# Patient Record
Sex: Female | Born: 1960 | Race: White | Hispanic: No | Smoking: Former smoker
Health system: Southern US, Community
[De-identification: ages and names within clinical notes are randomized; demographics above are authoritative.]

## PROBLEM LIST (undated history)

## (undated) ENCOUNTER — Emergency Department (HOSPITAL_COMMUNITY): Admission: EM | Payer: Self-pay | Source: Home / Self Care

## (undated) DIAGNOSIS — E079 Disorder of thyroid, unspecified: Secondary | ICD-10-CM

## (undated) DIAGNOSIS — E785 Hyperlipidemia, unspecified: Secondary | ICD-10-CM

## (undated) HISTORY — PX: CHOLECYSTECTOMY: SHX55

## (undated) HISTORY — PX: SHOULDER SURGERY: SHX246

## (undated) HISTORY — DX: Disorder of thyroid, unspecified: E07.9

## (undated) HISTORY — DX: Hyperlipidemia, unspecified: E78.5

---

## 2002-07-25 ENCOUNTER — Other Ambulatory Visit: Admission: RE | Admit: 2002-07-25 | Discharge: 2002-07-25 | Payer: Self-pay | Admitting: Family Medicine

## 2004-08-30 ENCOUNTER — Other Ambulatory Visit: Admission: RE | Admit: 2004-08-30 | Discharge: 2004-08-30 | Payer: Self-pay | Admitting: Obstetrics and Gynecology

## 2005-10-04 ENCOUNTER — Ambulatory Visit (HOSPITAL_COMMUNITY): Admission: RE | Admit: 2005-10-04 | Discharge: 2005-10-04 | Payer: Self-pay | Admitting: Obstetrics and Gynecology

## 2007-05-22 ENCOUNTER — Encounter: Payer: Self-pay | Admitting: Cardiovascular Disease

## 2007-06-14 HISTORY — PX: ANKLE RECONSTRUCTION: SHX1151

## 2008-12-08 ENCOUNTER — Ambulatory Visit: Payer: Self-pay | Admitting: Gastroenterology

## 2008-12-22 ENCOUNTER — Ambulatory Visit (HOSPITAL_COMMUNITY): Admission: EM | Admit: 2008-12-22 | Discharge: 2008-12-22 | Payer: Self-pay | Admitting: Emergency Medicine

## 2008-12-22 ENCOUNTER — Ambulatory Visit: Payer: Self-pay | Admitting: Gastroenterology

## 2008-12-24 ENCOUNTER — Telehealth: Payer: Self-pay | Admitting: Gastroenterology

## 2008-12-31 ENCOUNTER — Encounter: Payer: Self-pay | Admitting: Gastroenterology

## 2009-01-01 ENCOUNTER — Telehealth (INDEPENDENT_AMBULATORY_CARE_PROVIDER_SITE_OTHER): Payer: Self-pay | Admitting: *Deleted

## 2009-10-20 IMAGING — CT CT HEAD W/O CM
2 series · 17 of 30 positions shown, 20 images · non-contrast
Comparison: None.

CLINICAL DATA: Headache.  Hypertension.

CT HEAD WITHOUT CONTRAST
TECHNIQUE: Contiguous axial images were obtained from the base of
the skull through the vertex without contrast.

[Series 2: head_seq 4.5 h37s st · axial · 0.43mm/px · z∈[-121,+5]mm · 10 of 36 slices shown, 13 images]
[im 4/36  brain]
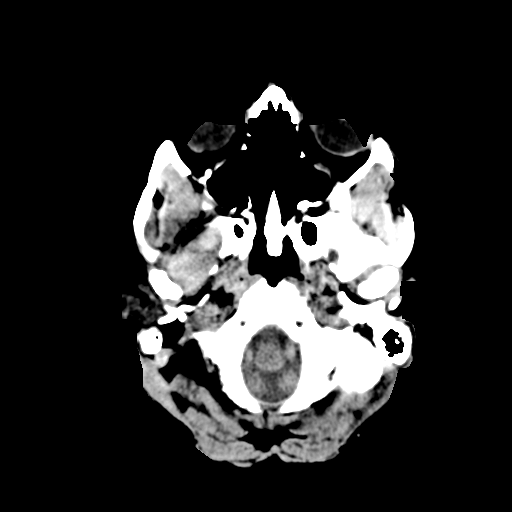
[im 4/36  bone]
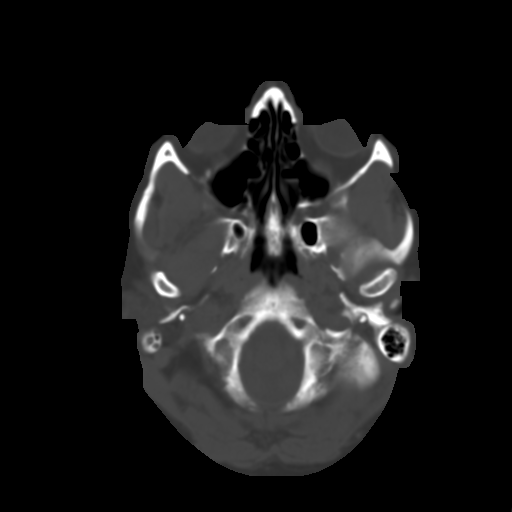
[im 7/36  brain]
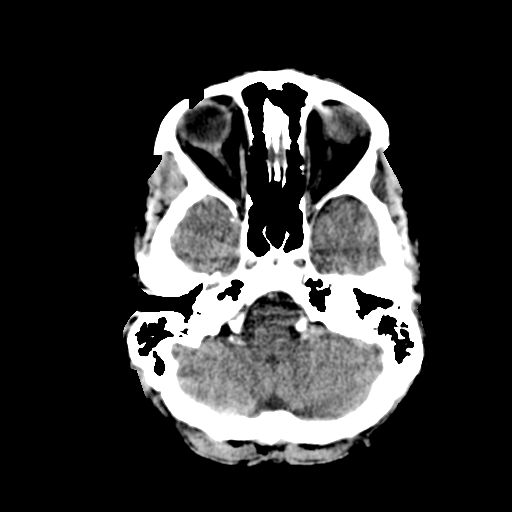
[im 10/36  brain]
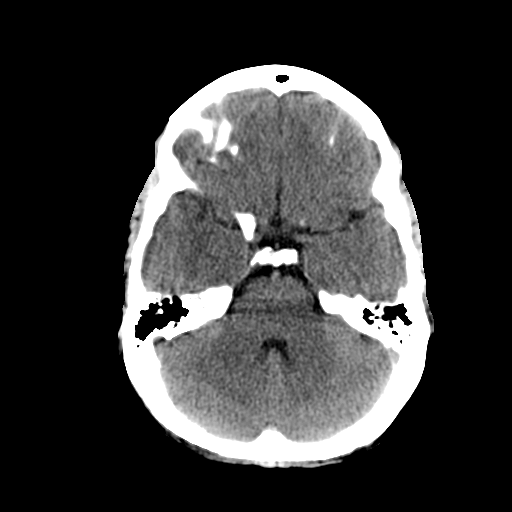
[im 13/36  brain]
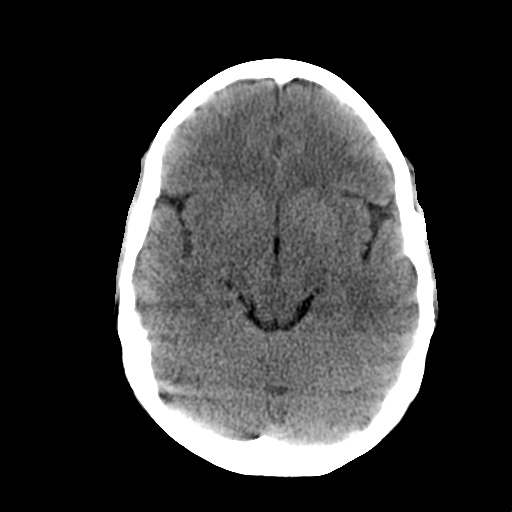
[im 16/36  brain]
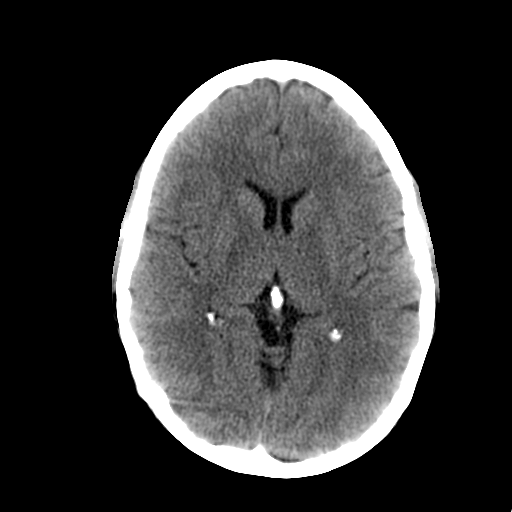
[im 16/36  bone]
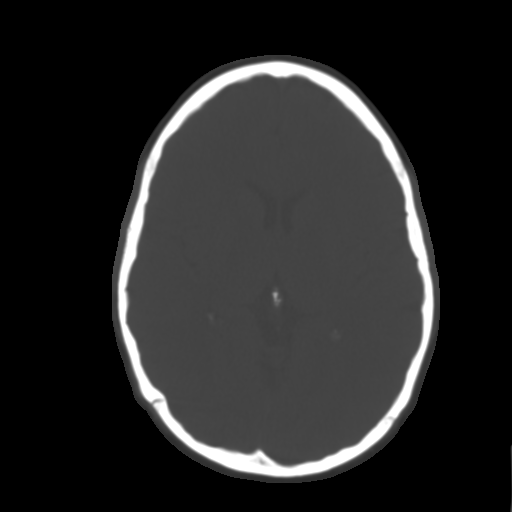
[im 20/36  brain]
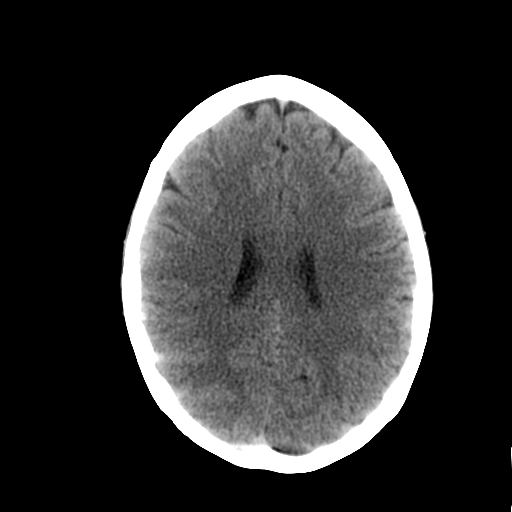
[im 23/36  brain]
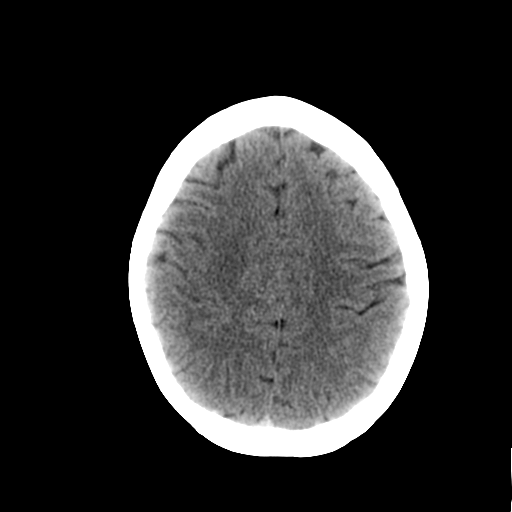
[im 26/36  brain]
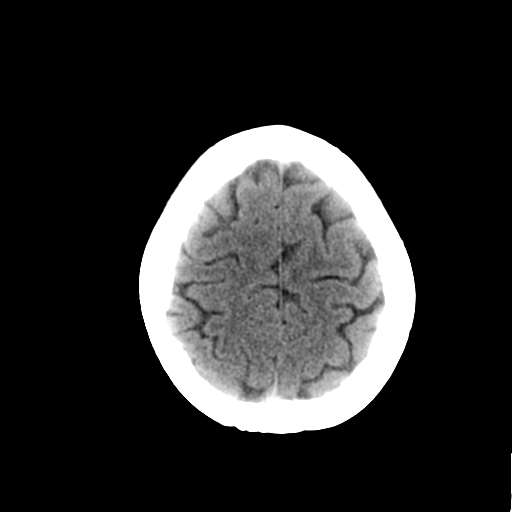
[im 29/36  brain]
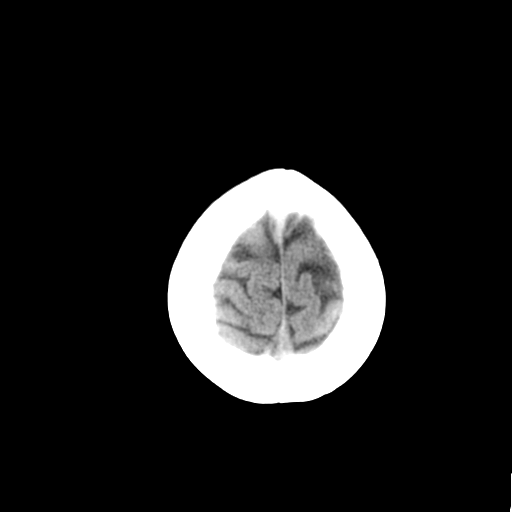
[im 29/36  bone]
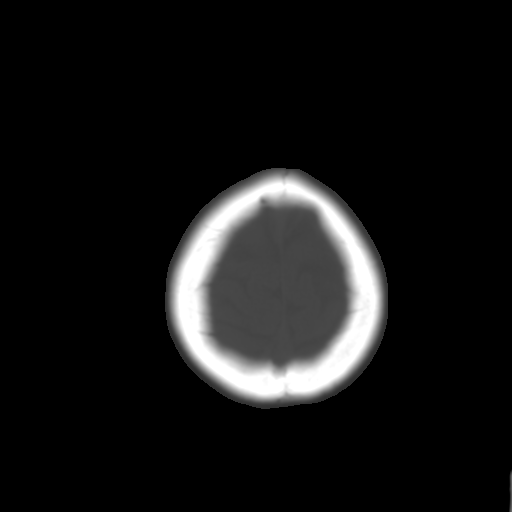
[im 32/36  brain]
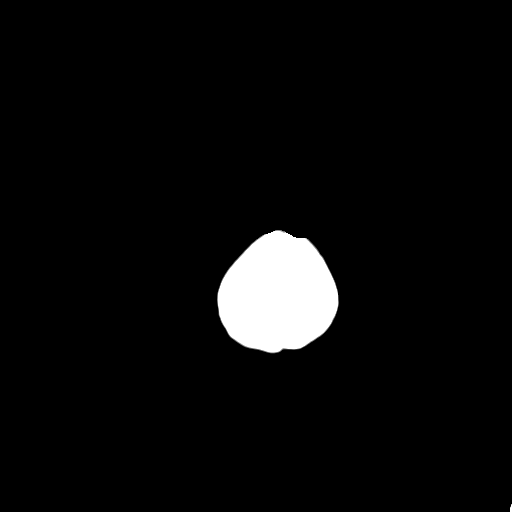

[Series 3: head_seq 3.0 h60s bone · axial · 0.43mm/px · z∈[-118,-6]mm · 7 of 54 slices shown]
[im 7/54  bone]
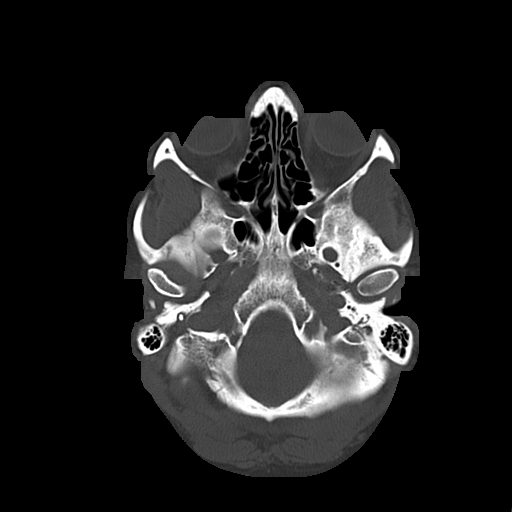
[im 13/54  bone]
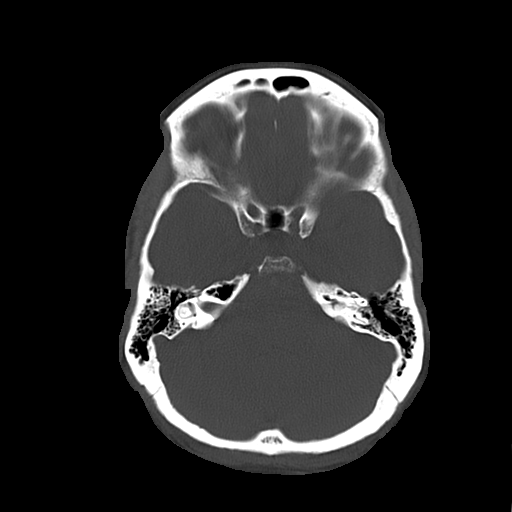
[im 19/54  bone]
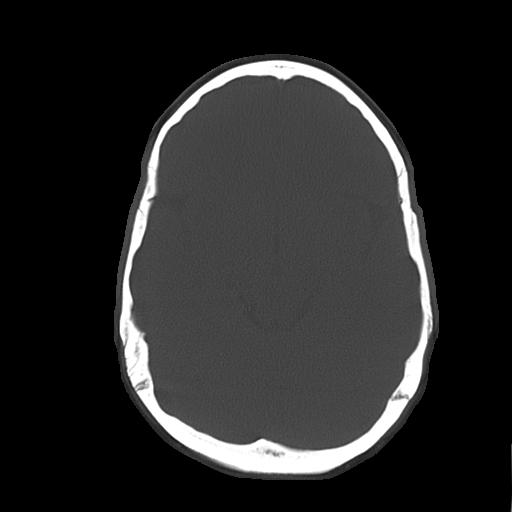
[im 25/54  bone]
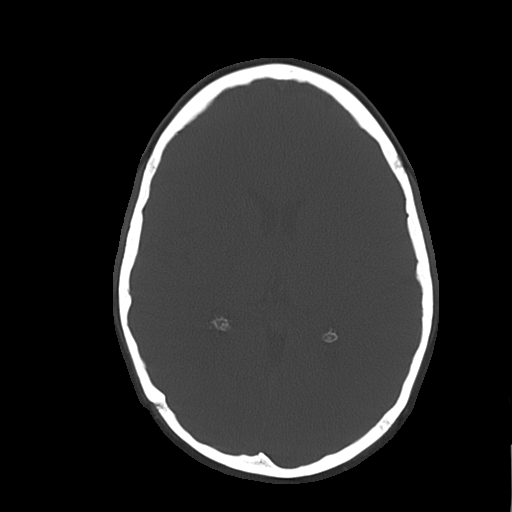
[im 32/54  bone]
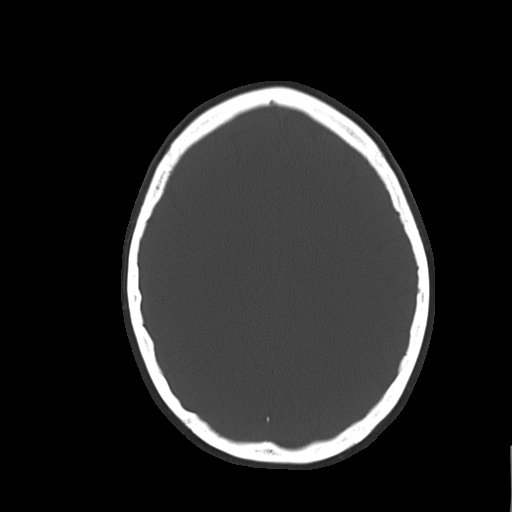
[im 38/54  bone]
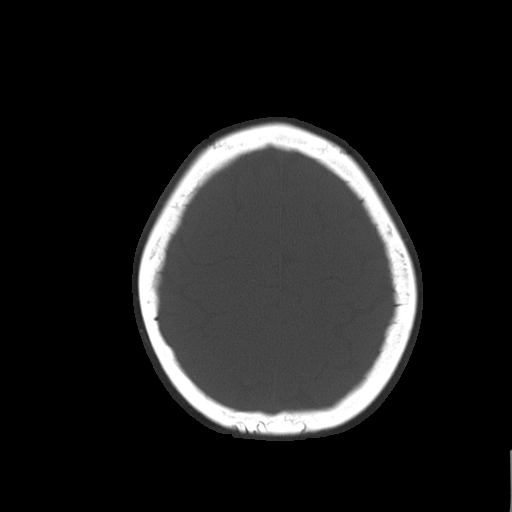
[im 44/54  bone]
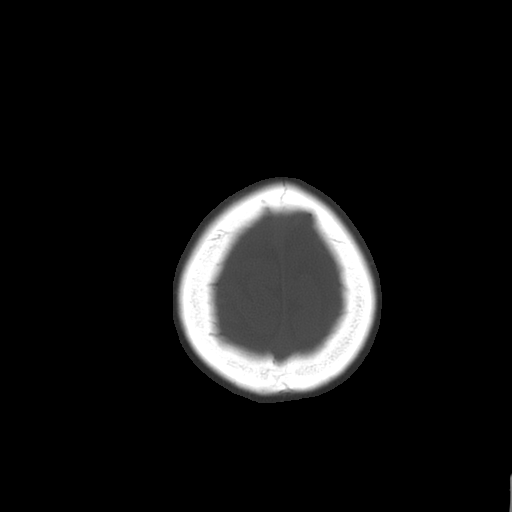

[17 of 30 positions shown; findings below may reference images not displayed]

FINDINGS: Normal appearing cerebral hemispheres and posterior fossa
structures.  Normal size and position of the ventricles.  No
intracranial hemorrhage, mass lesion or evidence of acute
infarction.  Unremarkable bones and included portions of the
paranasal sinuses.
IMPRESSION: Normal examination.

## 2011-10-18 ENCOUNTER — Encounter: Payer: Self-pay | Admitting: *Deleted

## 2012-03-14 ENCOUNTER — Encounter: Payer: Self-pay | Admitting: Cardiovascular Disease

## 2015-01-14 ENCOUNTER — Encounter: Payer: Self-pay | Admitting: Gastroenterology

## 2016-08-30 ENCOUNTER — Ambulatory Visit (INDEPENDENT_AMBULATORY_CARE_PROVIDER_SITE_OTHER): Payer: BLUE CROSS/BLUE SHIELD | Admitting: Orthopaedic Surgery

## 2016-08-30 ENCOUNTER — Encounter (INDEPENDENT_AMBULATORY_CARE_PROVIDER_SITE_OTHER): Payer: Self-pay | Admitting: Orthopaedic Surgery

## 2016-08-30 ENCOUNTER — Ambulatory Visit (INDEPENDENT_AMBULATORY_CARE_PROVIDER_SITE_OTHER): Payer: Self-pay

## 2016-08-30 VITALS — BP 129/92 | HR 64 | Resp 14 | Ht 65.0 in | Wt 192.0 lb

## 2016-08-30 DIAGNOSIS — S93492A Sprain of other ligament of left ankle, initial encounter: Secondary | ICD-10-CM

## 2016-08-30 DIAGNOSIS — M25572 Pain in left ankle and joints of left foot: Secondary | ICD-10-CM

## 2016-08-30 NOTE — Progress Notes (Signed)
Office Visit Note   Patient: Kristin Ramirez           Date of Birth: 10-22-1960           MRN: [id number] Visit Date: 08/30/2016              Requested by: No referring provider defined for this encounter. PCP: No primary care provider on file.   Assessment & Plan: Visit Diagnoses:  1. Sprain of anterior talofibular ligament of left ankle, initial encounter   2. Pain in left ankle and joints of left foot     Plan: #1: Equalizer boot #2: Ice and elevate  Follow-Up Instructions: Return in about 2 weeks (around 09/13/2016).   Orders:  Orders Placed This Encounter  Procedures  . XR Ankle Complete Left   No orders of the defined types were placed in this encounter.     Procedures: No procedures performed   Clinical Data: No additional findings.   Subjective: Chief Complaint  Patient presents with  . Left Ankle - Edema, Pain    Kristin Ramirez is a 56 year old female presenting with left ankle pain. She had a Chrisman Snook procedure  left ankle in 2009 and a done very well until this weekend. She states at that time she went to get up from a sitting position however her foot and ankle was tingling but she did not wait until the sensation came back. She got up and started to go and that is when she twisted her ankle, that of a more inversion type injury. She feared something happened as she has edema, ecchymosis and pain. Seen today for evaluation.    Review of Systems  Constitutional: Negative.   HENT: Negative.   Respiratory: Negative.   Cardiovascular: Negative.   Gastrointestinal: Negative.   Genitourinary: Negative.   Skin: Negative.   Neurological: Negative.   Hematological: Negative.   Psychiatric/Behavioral: Negative.      Objective: Vital Signs: BP (!) 129/92   Pulse 64   Resp 14   Ht 5\' 5"  (1.651 m)   Wt 192 lb (87.1 kg)   BMI 31.95 kg/m   Physical Exam  Constitutional: She is oriented to person, place, and time. She appears well-developed and  well-nourished.  HENT:  Head: Normocephalic and atraumatic.  Eyes: EOM are normal. Pupils are equal, round, and reactive to light.  Pulmonary/Chest: Effort normal.  Neurological: She is alert and oriented to person, place, and time.  Skin: Skin is warm and dry.  Psychiatric: She has a normal mood and affect. Her behavior is normal. Judgment and thought content normal.    Left Ankle Exam  Swelling: moderate  Tenderness  The patient is experiencing tenderness in the ATF, CF and lateral malleolus.   Range of Motion  Dorsiflexion: 10  Plantar flexion: 20   Tests  Anterior drawer: 1+ Varus tilt: 2+  Other  Scars: present Sensation: normal  Comments:  She certainly has more inversion laxity then drawer at this time. Diffusely about the ankle but not really much medially. Lot of swelling laterally. Fibular tibial pain.      Specialty Comments:  No specialty comments available.  Imaging: Xr Ankle Complete Left  Result Date: 08/30/2016 Three-view x-rays of the left ankle reveals certainly some spurring medially with some degenerative changes noted at the tibiotalar joint. Also some probable HO in the chair fibular area. Some cystic changes in the talus as well as the anterior tibiotalar joint. Os trigone was noted  PMFS History: There are no active problems to display for this patient.  Past Medical History:  Diagnosis Date  . Hyperlipidemia   . Thyroid disease     Family History  Problem Relation Age of Onset  . Hypertension Mother   . Hypertension Father   . Heart disease Father     Past Surgical History:  Procedure Laterality Date  . ANKLE RECONSTRUCTION  06/2007   left  . CHOLECYSTECTOMY    . SHOULDER SURGERY     right and left   Social History   Occupational History  . Not on file.   Social History Main Topics  . Smoking status: Former Smoker    Quit date: 10/17/2009  . Smokeless tobacco: Never Used  . Alcohol use No  . Drug use: No  . Sexual  activity: Not on file

## 2016-09-14 ENCOUNTER — Encounter (INDEPENDENT_AMBULATORY_CARE_PROVIDER_SITE_OTHER): Payer: Self-pay | Admitting: Orthopedic Surgery

## 2016-09-14 ENCOUNTER — Ambulatory Visit (INDEPENDENT_AMBULATORY_CARE_PROVIDER_SITE_OTHER): Payer: BLUE CROSS/BLUE SHIELD | Admitting: Orthopedic Surgery

## 2016-09-14 ENCOUNTER — Ambulatory Visit (INDEPENDENT_AMBULATORY_CARE_PROVIDER_SITE_OTHER): Payer: Self-pay

## 2016-09-14 VITALS — BP 136/81 | HR 54 | Resp 12 | Ht 65.0 in | Wt 195.0 lb

## 2016-09-14 DIAGNOSIS — M25572 Pain in left ankle and joints of left foot: Secondary | ICD-10-CM

## 2016-09-14 DIAGNOSIS — S93492D Sprain of other ligament of left ankle, subsequent encounter: Secondary | ICD-10-CM

## 2016-09-14 NOTE — Progress Notes (Signed)
Office Visit Note   Patient: Kristin Ramirez           Date of Birth: 09-25-1960           MRN: [id number] Visit Date: 09/14/2016              Requested by: No referring provider defined for this encounter. PCP: No PCP Per Patient   Assessment & Plan: Visit Diagnoses:  1. Sprain of anterior talofibular ligament of left ankle, subsequent encounter   2. Pain of joint of left ankle and foot     Plan:  #1: Swedo ankle brace was placed. She was comfortable in this #2: If this is painful and she can return to the boot.   Follow-Up Instructions: Return in about 3 weeks (around 10/05/2016).   Orders:  Orders Placed This Encounter  Procedures  . XR Ankle Complete Left   No orders of the defined types were placed in this encounter.     Procedures: No procedures performed   Clinical Data: No additional findings.   Subjective: Chief Complaint  Patient presents with  . Left Ankle - Follow-up    Patient is here today for a 2 week follow up of left ankle.  Patient is wearing boot today, she states that she has been wearing boot everyday.  She complains of some swelling and being "uncomfortable" in boot.  She states she is not having any weakness or pain.     Review of Systems  Constitutional: Negative.   HENT: Negative.   Respiratory: Negative.   Cardiovascular: Negative.   Gastrointestinal: Negative.   Genitourinary: Negative.   Skin: Negative.   Neurological: Negative.   Hematological: Negative.   Psychiatric/Behavioral: Negative.      Objective: Vital Signs: BP 136/81   Pulse (!) 54   Resp 12   Ht  (1.651 m)   Wt 195 lb (88.5 kg)   BMI 32.45 kg/m   Physical Exam  Constitutional: She is oriented to person, place, and time. She appears well-developed and well-nourished.  HENT:  Head: Normocephalic and atraumatic.  Eyes: EOM are normal. Pupils are equal, round, and reactive to light.  Pulmonary/Chest: Effort normal.  Neurological: She is alert and  oriented to person, place, and time.  Skin: Skin is warm and dry.  Psychiatric: She has a normal mood and affect. Her behavior is normal. Judgment and thought content normal.    Left Ankle Exam  Swelling: mild  Tenderness  The patient is experiencing tenderness in the ATF and lateral malleolus.   Tests  Anterior drawer: 1+ Varus tilt: 1+  Other  Erythema: absent Scars: absent Sensation: normal Pulse: present  Comments:  Assessment tenderness over the distal fibula. Also over the ATF. Less over the PTF.      Specialty Comments:  No specialty comments available.  Imaging: Xr Ankle Complete Left  Result Date: 09/14/2016 Review x-rays of the left ankle do not reveal any occult fractures. He does have changes from her previous ligament reconstruction surgery.    PMFS History: There are no active problems to display for this patient.  Past Medical History:  Diagnosis Date  . Hyperlipidemia   . Thyroid disease     Family History  Problem Relation Age of Onset  . Hypertension Mother   . Hypertension Father   . Heart disease Father     Past Surgical History:  Procedure Laterality Date  . ANKLE RECONSTRUCTION  06/2007   left  . CHOLECYSTECTOMY    .  SHOULDER SURGERY     right and left   Social History   Occupational History  . Not on file.   Social History Main Topics  . Smoking status: Former Smoker    Quit date: 10/17/2009  . Smokeless tobacco: Never Used  . Alcohol use No  . Drug use: No  . Sexual activity: Not on file

## 2016-10-05 ENCOUNTER — Ambulatory Visit (INDEPENDENT_AMBULATORY_CARE_PROVIDER_SITE_OTHER): Payer: BLUE CROSS/BLUE SHIELD | Admitting: Orthopedic Surgery

## 2019-01-09 ENCOUNTER — Encounter: Payer: Self-pay | Admitting: Gastroenterology

## 2019-07-29 DIAGNOSIS — M2041 Other hammer toe(s) (acquired), right foot: Secondary | ICD-10-CM | POA: Diagnosis not present

## 2019-10-15 DIAGNOSIS — L570 Actinic keratosis: Secondary | ICD-10-CM | POA: Diagnosis not present

## 2019-10-15 DIAGNOSIS — L821 Other seborrheic keratosis: Secondary | ICD-10-CM | POA: Diagnosis not present

## 2019-10-15 DIAGNOSIS — D225 Melanocytic nevi of trunk: Secondary | ICD-10-CM | POA: Diagnosis not present

## 2019-10-15 DIAGNOSIS — Z86018 Personal history of other benign neoplasm: Secondary | ICD-10-CM | POA: Diagnosis not present

## 2019-10-15 DIAGNOSIS — L814 Other melanin hyperpigmentation: Secondary | ICD-10-CM | POA: Diagnosis not present

## 2019-10-24 DIAGNOSIS — Z0000 Encounter for general adult medical examination without abnormal findings: Secondary | ICD-10-CM | POA: Diagnosis not present

## 2019-10-24 DIAGNOSIS — Z01419 Encounter for gynecological examination (general) (routine) without abnormal findings: Secondary | ICD-10-CM | POA: Diagnosis not present

## 2019-10-24 DIAGNOSIS — Z6832 Body mass index (BMI) 32.0-32.9, adult: Secondary | ICD-10-CM | POA: Diagnosis not present

## 2019-10-24 DIAGNOSIS — E039 Hypothyroidism, unspecified: Secondary | ICD-10-CM | POA: Diagnosis not present

## 2019-11-04 DIAGNOSIS — Z1211 Encounter for screening for malignant neoplasm of colon: Secondary | ICD-10-CM | POA: Diagnosis not present

## 2019-11-07 LAB — COLOGUARD: COLOGUARD: NEGATIVE

## 2019-11-20 DIAGNOSIS — Z1231 Encounter for screening mammogram for malignant neoplasm of breast: Secondary | ICD-10-CM | POA: Diagnosis not present

## 2020-01-31 DIAGNOSIS — M25561 Pain in right knee: Secondary | ICD-10-CM | POA: Diagnosis not present

## 2020-01-31 DIAGNOSIS — M1711 Unilateral primary osteoarthritis, right knee: Secondary | ICD-10-CM | POA: Diagnosis not present

## 2020-08-27 DIAGNOSIS — Z713 Dietary counseling and surveillance: Secondary | ICD-10-CM | POA: Diagnosis not present

## 2020-11-19 DIAGNOSIS — E039 Hypothyroidism, unspecified: Secondary | ICD-10-CM | POA: Diagnosis not present

## 2021-04-20 DIAGNOSIS — R7309 Other abnormal glucose: Secondary | ICD-10-CM | POA: Diagnosis not present

## 2021-04-20 DIAGNOSIS — E782 Mixed hyperlipidemia: Secondary | ICD-10-CM | POA: Diagnosis not present

## 2021-04-20 DIAGNOSIS — E039 Hypothyroidism, unspecified: Secondary | ICD-10-CM | POA: Diagnosis not present

## 2021-07-01 DIAGNOSIS — Z1231 Encounter for screening mammogram for malignant neoplasm of breast: Secondary | ICD-10-CM | POA: Diagnosis not present

## 2021-11-23 ENCOUNTER — Ambulatory Visit (INDEPENDENT_AMBULATORY_CARE_PROVIDER_SITE_OTHER): Payer: 59 | Admitting: Nurse Practitioner

## 2021-11-23 ENCOUNTER — Encounter (HOSPITAL_BASED_OUTPATIENT_CLINIC_OR_DEPARTMENT_OTHER): Payer: Self-pay | Admitting: Nurse Practitioner

## 2021-11-23 VITALS — BP 117/72 | HR 71 | Ht 65.0 in | Wt 219.0 lb

## 2021-11-23 DIAGNOSIS — E039 Hypothyroidism, unspecified: Secondary | ICD-10-CM | POA: Insufficient documentation

## 2021-11-23 DIAGNOSIS — Z6836 Body mass index (BMI) 36.0-36.9, adult: Secondary | ICD-10-CM | POA: Diagnosis not present

## 2021-11-23 DIAGNOSIS — E890 Postprocedural hypothyroidism: Secondary | ICD-10-CM

## 2021-11-23 DIAGNOSIS — Z0000 Encounter for general adult medical examination without abnormal findings: Secondary | ICD-10-CM | POA: Diagnosis not present

## 2021-11-23 NOTE — Progress Notes (Signed)
Mammo- [place]  T dap not known No shingles vac PAP 2 yrs [facility] -2022

## 2021-11-23 NOTE — Progress Notes (Signed)
Kristin Render, DNP, AGNP-c [facility] [address]  [place], [place] [address] [phone]  New patient visit   Patient: Kristin Ramirez   DOB: July 17, 1960   61 y.o. Female  MRN: [id number] Visit Date: 11/23/2021  Patient Care Team: Kristin Ramirez, Kristin Pesa, NP as PCP - General (Nurse Practitioner)  Today's Vitals   11/23/21 0934  BP: 117/72  Pulse: 71  SpO2: 97%  Weight: 219 lb (99.3 kg)  Height: _0  (1.651 m)   Body mass index is 36.44 kg/m.   Today's healthcare provider: Orma Render, NP   Chief Complaint  Patient presents with   New Patient (Initial Visit)    Patient presents today to establish care. 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 female who presents today as a new patient to establish care.    Patient endorses the following concerns presently: Hypothyroid - previous tumor removal at about 61 years of age - has been on synthroid since that time - levels have overall been stable - currently on annual monitoring.   She has recently retired and tells me she is enjoying retirement She eats a healthy well balanced diet with more chicken and fish as opposed to red meats.  She stays active.   History reviewed and reveals the following: Past Medical History:  Diagnosis Date   Hyperlipidemia    Thyroid disease    Past Surgical History:  Procedure Laterality Date   ANKLE RECONSTRUCTION  06/2007   left   CHOLECYSTECTOMY     SHOULDER SURGERY     right and left   Family Status  Relation Name Status   Mother  Alive   Father  Deceased   Sister  Alive   Brother  Alive   Family History  Problem Relation Age of Onset   Hypertension Mother    Hypertension Father    Heart disease Father    Bladder Cancer Brother    Social History   Socioeconomic History   Marital status: Divorced    Spouse name: Not on file   Number of  children: 0   Years of education: Not on file   Highest education level: Not on file  Occupational History   Occupation: retired from [employer] Use   Smoking status: Former    Types: Cigarettes    Quit date: 10/17/2009    Years since quitting: 12.1   Smokeless tobacco: Never  Vaping Use   Vaping Use: Never used  Substance and Sexual Activity   Alcohol use: No   Drug use: No   Sexual activity: Not Currently  Other Topics Concern   Not on file  Social History Narrative   Not on file   Social Determinants of Health   Financial Resource Strain: Not on file  Food Insecurity: Not on file  Transportation Needs: Not on file  Physical Activity: Not on file  Stress: Not on file  Social Connections: Not on file   Outpatient Medications Prior to Visit  Medication Sig   Multiple Vitamin (MULTIVITAMIN) tablet Take 1 tablet by mouth daily.   [DISCONTINUED] levothyroxine (SYNTHROID) 125 MCG tablet Take 125 mcg by mouth daily.   [DISCONTINUED] fish oil-omega-3 fatty acids 1000 MG capsule Take 1 g by mouth daily. (Patient not taking: Reported on 11/23/2021)   [DISCONTINUED] Flaxseed Oil OIL 1 tablet by  Does not apply route daily. (Patient not taking: Reported on 11/23/2021)   [DISCONTINUED] levothyroxine (SYNTHROID) 150 MCG tablet Take 150 mcg by mouth daily. (Patient not taking: Reported on 11/23/2021)   [DISCONTINUED] simvastatin (ZOCOR) 40 MG tablet Take 40 mg by mouth every evening. (Patient not taking: Reported on 11/23/2021)   No facility-administered medications prior to visit.   No Known Allergies  There is no immunization history on file for this patient.  Review of Systems All review of systems negative except what is listed in the HPI   Objective    BP 117/72   Pulse 71   Ht _0  (1.651 m)   Wt 219 lb (99.3 kg)   SpO2 97%   BMI 36.44 kg/m  Physical Exam Vitals and nursing note reviewed.  Constitutional:      General: She is not in acute distress.     Appearance: Normal appearance.  Eyes:     Extraocular Movements: Extraocular movements intact.     Conjunctiva/sclera: Conjunctivae normal.     Pupils: Pupils are equal, round, and reactive to light.  Neck:     Vascular: No carotid bruit.  Cardiovascular:     Rate and Rhythm: Normal rate and regular rhythm.     Pulses: Normal pulses.     Heart sounds: Normal heart sounds. No murmur heard. Pulmonary:     Effort: Pulmonary effort is normal.     Breath sounds: Normal breath sounds. No wheezing.  Abdominal:     General: Bowel sounds are normal.     Palpations: Abdomen is soft.  Musculoskeletal:        General: Normal range of motion.     Cervical back: Normal range of motion. No tenderness.     Right lower leg: No edema.     Left lower leg: No edema.  Lymphadenopathy:     Cervical: No cervical adenopathy.  Skin:    General: Skin is warm and dry.     Capillary Refill: Capillary refill takes less than 2 seconds.  Neurological:     General: No focal deficit present.     Mental Status: She is alert and oriented to person, place, and time.  Psychiatric:        Mood and Affect: Mood normal.        Behavior: Behavior normal.        Thought Content: Thought content normal.        Judgment: Judgment normal.     Results for orders placed or performed in visit on 11/23/21  Hemoglobin A1c  Result Value Ref Range   Hgb A1c MFr Bld 5.6 4.8 - 5.6 %   Est. average glucose Bld gHb Est-mCnc 114 mg/dL  Comprehensive metabolic panel  Result Value Ref Range   Glucose 96 70 - 99 mg/dL   BUN 11 8 - 27 mg/dL   Creatinine, Ser 0.79 0.57 - 1.00 mg/dL   eGFR 84 >59 mL/min/1.73   BUN/Creatinine Ratio 14 12 - 28   Sodium 141 134 - 144 mmol/L   Potassium 4.7 3.5 - 5.2 mmol/L   Chloride 106 96 - 106 mmol/L   CO2 22 20 - 29 mmol/L   Calcium 9.7 8.7 - 10.3 mg/dL   Total Protein 6.7 6.0 - 8.5 g/dL   Albumin 4.1 3.8 - 4.8 g/dL   Globulin, Total 2.6 1.5 - 4.5 g/dL   Albumin/Globulin Ratio 1.6 1.2 -  2.2   Bilirubin Total 0.5 0.0 - 1.2 mg/dL   Alkaline Phosphatase  73 44 - 121 IU/L   AST 18 0 - 40 IU/L   ALT 13 0 - 32 IU/L  CBC With Diff/Platelet  Result Value Ref Range   WBC 8.8 3.4 - 10.8 x10E3/uL   RBC 4.77 3.77 - 5.28 x10E6/uL   Hemoglobin 13.0 11.1 - 15.9 g/dL   Hematocrit 39.5 34.0 - 46.6 %   MCV 83 79 - 97 fL   MCH 27.3 26.6 - 33.0 pg   MCHC 32.9 31.5 - 35.7 g/dL   RDW 14.5 11.7 - 15.4 %   Platelets 371 150 - 450 x10E3/uL   Neutrophils 66 Not Estab. %   Lymphs 18 Not Estab. %   Monocytes 5 Not Estab. %   Eos 10 Not Estab. %   Basos 1 Not Estab. %   Neutrophils Absolute 5.8 1.4 - 7.0 x10E3/uL   Lymphocytes Absolute 1.6 0.7 - 3.1 x10E3/uL   Monocytes Absolute 0.5 0.1 - 0.9 x10E3/uL   EOS (ABSOLUTE) 0.9 (H) 0.0 - 0.4 x10E3/uL   Basophils Absolute 0.1 0.0 - 0.2 x10E3/uL   Immature Granulocytes 0 Not Estab. 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  Patient has historically been stable on current dose of levothyroxine without need for changes.  Will obtain labs today for monitoring.  No alarm symptoms present at this time.  We will make changes to plan of care based on laboratory findings as needed.  Patient aware if any new symptoms occur to contact the office for follow-up.      Relevant Medications   levothyroxine (SYNTHROID) 125 MCG tablet   Other  Relevant Orders   Thyroid Panel With TSH (Completed)   Thyroid Panel With TSH   Body mass index (BMI) 36.0-36.9, adult    Elevated BMI today with no alarm symptoms present.  Recommendations for diet and activity provided to patient on after visit summary.  We will also obtain labs today for baseline.      Other Visit Diagnoses     Healthcare maintenance       Relevant Orders   Hemoglobin A1c (Completed)   Comprehensive metabolic panel (Completed)   CBC With Diff/Platelet (Completed)   Lipid panel (Completed)   Thyroid Panel With TSH (Completed)        Return in about 1 year (around 11/24/2022) for CPE today- CPE in 1 year.      Draco Malczewski, Kristin Pesa, NP, DNP, AGNP-C [facility] [place]

## 2021-11-23 NOTE — Patient Instructions (Signed)
Thank you for choosing [place] [facility] for your Primary Care needs. I am excited for the opportunity to partner with you to meet your health care goals. It was a pleasure meeting you today!  Recommendations from today's visit: We will get labs today to make sure everything looks OK and your thyroid is stable.  I will send in the refill of the synthroid (levothyroxine) once we get those results - in case we need to make any changes.  If you should have any concerns or health needs, please do not hesitate to reach out.   Information on diet, exercise, and health maintenance recommendations are listed below. This is information to help you be sure you are on track for optimal health and monitoring.   Please look over this and let us know if you have any questions or if you have completed any of the health maintenance outside of [place] so that we can be sure your records are up to date.  ___________________________________________________________ About Me: I am an Adult-Geriatric Nurse Practitioner with a background in caring for patients for more than 20 years with a strong intensive care background. I provide primary care and sports medicine services to patients age 45 and older within this office. My education had a strong focus on caring for the older adult population, which I am passionate about. I am also the director of the APP Fellowship with [facility].   My desire is to provide you with the best service through preventive medicine and supportive care. I consider you a part of the medical team and value your input. I work diligently to ensure that you are heard and your needs are met in a safe and effective manner. I want you to feel comfortable with me as your provider and want you to know that your health concerns are important to me.  For your information, our office hours are: Monday, Tuesday, and Thursday 8:00 AM - 5:00 PM Wednesday and Friday 8:00 AM  - 12:00 PM.   In my time away from the office I am teaching new APP's within the system and am unavailable, but my partner, Dr. Burnard Bunting is in the office for emergent needs.   If you have questions or concerns, please call our office at [phone] or send [place] a MyChart message and we will respond as quickly as possible.  ____________________________________________________________ MyChart:  For all urgent or time sensitive needs we ask that you please call the office to avoid delays. Our number is [phone]. MyChart is not constantly monitored and due to the large volume of messages a day, replies may take up to 72 business hours.  MyChart Policy: MyChart allows for you to see your visit notes, after visit summary, provider recommendations, lab and tests results, make an appointment, request refills, and contact your provider or the office for non-urgent questions or concerns. Providers are seeing patients during normal business hours and do not have built in time to review MyChart messages.  We ask that you allow a minimum of 3 business days for responses to [employer]. For this reason, please do not send urgent requests through [place]. Please call the office at [phone]. New and ongoing conditions may require a visit. We have virtual and in person visit available for your convenience.  Complex MyChart concerns may require a visit. Your provider may request you schedule a virtual or in person visit to ensure we are providing the best care possible. MyChart messages sent  after 11:00 AM on Friday will not be received by the provider until Monday morning.    Lab and Test Results: You will receive your lab and test results on MyChart as soon as they are completed and results have been sent by the lab or testing facility. Due to this service, you will receive your results BEFORE your provider.  I review lab and tests results each morning prior to seeing patients. Some results require  collaboration with other providers to ensure you are receiving the most appropriate care. For this reason, we ask that you please allow a minimum of 3-5 business days from the time the ALL results have been received for your provider to receive and review lab and test results and contact you about these.  Most lab and test result comments from the provider will be sent through [place]. Your provider may recommend changes to the plan of care, follow-up visits, repeat testing, ask questions, or request an office visit to discuss these results. You may reply directly to this message or call the office at [phone] to provide information for the provider or set up an appointment. In some instances, you will be called with test results and recommendations. Please let us know if this is preferred and we will make note of this in your chart to provide this for you.    If you have not heard a response to your lab or test results in 5 business days from all results returning to [place], please call the office to let us know. We ask that you please avoid calling prior to this time unless there is an emergent concern. Due to high call volumes, this can delay the resulting process.  After Hours: For all non-emergency after hours needs, please call the office at [phone] and select the option to reach the on-call provider service. On-call services are shared between multiple [place] offices and therefore it will not be possible to speak directly with your provider. On-call providers may provide medical advice and recommendations, but are unable to provide refills for maintenance medications.  For all emergency or urgent medical needs after normal business hours, we recommend that you seek care at the closest Urgent Care or Emergency Department to ensure appropriate treatment in a timely manner.  [facility] [place] has a 24 hour emergency room located on the ground floor for your convenience.    Urgent Concerns During the Business Day Providers are seeing patients from 8AM to [place] with a busy schedule and are most often not able to respond to non-urgent calls until the end of the day or the next business day. If you should have URGENT concerns during the day, please call and speak to the nurse or schedule a same day appointment so that we can address your concern without delay.   Thank you, again, for choosing me as your health care partner. I appreciate your trust and look forward to learning more about you.   Worthy Keeler, DNP, AGNP-c ___________________________________________________________  Health Maintenance Recommendations Screening Testing Mammogram Every 1 -2 years based on history and risk factors Starting at age 63 Pap Smear Ages 21-39 every 3 years Ages 42-65 every 5 years with HPV testing More frequent testing may be required based on results and history Colon Cancer Screening Every 1-10 years based on test performed, risk factors, and history Starting at age 20 Bone Density Screening Every 2-10 years based on history Starting at age 18 for women Recommendations for men differ based  on medication usage, history, and risk factors AAA Screening One time ultrasound Men 4-23 years old who have every smoked Lung Cancer Screening Low Dose Lung CT every 12 months Age 32-80 years with a 30 pack-year smoking history who still smoke or who have quit within the last 15 years  Screening Labs Routine  Labs: Complete Blood Count (CBC), Complete Metabolic Panel (CMP), Cholesterol (Lipid Panel) Every 6-12 months based on history and medications May be recommended more frequently based on current conditions or previous results Hemoglobin A1c Lab Every 3-12 months based on history and previous results Starting at age 34 or earlier with diagnosis of diabetes, high cholesterol, BMI >26, and/or risk factors Frequent monitoring for patients with diabetes to ensure blood  sugar control Thyroid Panel (TSH w/ T3 & T4) Every 6 months based on history, symptoms, and risk factors May be repeated more often if on medication HIV One time testing for all patients 34 and older May be repeated more frequently for patients with increased risk factors or exposure Hepatitis C One time testing for all patients 52 and older May be repeated more frequently for patients with increased risk factors or exposure Gonorrhea, Chlamydia Every 12 months for all sexually active persons 13-24 years Additional monitoring may be recommended for those who are considered high risk or who have symptoms PSA Men 56-31 years old with risk factors Additional screening may be recommended from age 37-69 based on risk factors, symptoms, and history  Vaccine Recommendations Tetanus Booster All adults every 10 years Flu Vaccine All patients 6 months and older every year COVID Vaccine All patients 12 years and older Initial dosing with booster May recommend additional booster based on age and health history HPV Vaccine 2 doses all patients age 61-26 Dosing may be considered for patients over 26 Shingles Vaccine (Shingrix) 2 doses all adults 17 years and older Pneumonia (Pneumovax 23) All adults 67 years and older May recommend earlier dosing based on health history Pneumonia (Prevnar 22) All adults 52 years and older Dosed 1 year after Pneumovax 23  Additional Screening, Testing, and Vaccinations may be recommended on an individualized basis based on family history, health history, risk factors, and/or exposure.  __________________________________________________________  Diet Recommendations for All Patients  I recommend that all patients maintain a diet low in saturated fats, carbohydrates, and cholesterol. While this can be challenging at first, it is not impossible and small changes can make big differences.  Things to try: Decreasing the amount of soda, sweet tea, and/or juice  to one or less per day and replace with water While water is always the first choice, if you do not like water you may consider adding a water additive without sugar to improve the taste other sugar free drinks Replace potatoes with a brightly colored vegetable at dinner Use healthy oils, such as canola oil or olive oil, instead of butter or hard margarine Limit your bread intake to two pieces or less a day Replace regular pasta with low carb pasta options Bake, broil, or grill foods instead of frying Monitor portion sizes  Eat smaller, more frequent meals throughout the day instead of large meals  An important thing to remember is, if you love foods that are not great for your health, you don't have to give them up completely. Instead, allow these foods to be a reward when you have done well. Allowing yourself to still have special treats every once in a while is a nice way to tell yourself thank you  for working hard to keep yourself healthy.   Also remember that every day is a new day. If you have a bad day and "fall off the wagon", you can still climb right back up and keep moving along on your journey!  We have resources available to help you!  Some websites that may be helpful include: www.http://carter.biz/  Www.VeryWellFit.com _____________________________________________________________  Activity Recommendations for All Patients  I recommend that all adults get at least 20 minutes of moderate physical activity that elevates your heart rate at least 5 days out of the week.  Some examples include: Walking or jogging at a pace that allows you to carry on a conversation Cycling (stationary bike or outdoors) Water aerobics Yoga Weight lifting Dancing If physical limitations prevent you from putting stress on your joints, exercise in a pool or seated in a chair are excellent options.  Do determine your MAXIMUM heart rate for activity: YOUR AGE - 220 = MAX HeartRate   Remember! Do not  push yourself too hard.  Start slowly and build up your pace, speed, weight, time in exercise, etc.  Allow your body to rest between exercise and get good sleep. You will need more water than normal when you are exerting yourself. Do not wait until you are thirsty to drink. Drink with a purpose of getting in at least 8, 8 ounce glasses of water a day plus more depending on how much you exercise and sweat.    If you begin to develop dizziness, chest pain, abdominal pain, jaw pain, shortness of breath, headache, vision changes, lightheadedness, or other concerning symptoms, stop the activity and allow your body to rest. If your symptoms are severe, seek emergency evaluation immediately. If your symptoms are concerning, but not severe, please let us know so that we can recommend further evaluation.

## 2021-11-24 LAB — CBC WITH DIFF/PLATELET
Basophils Absolute: 0.1 10*3/uL (ref 0.0–0.2)
Basos: 1 %
EOS (ABSOLUTE): 0.9 10*3/uL — ABNORMAL HIGH (ref 0.0–0.4)
Eos: 10 %
Hematocrit: 39.5 % (ref 34.0–46.6)
Hemoglobin: 13 g/dL (ref 11.1–15.9)
Immature Grans (Abs): 0 10*3/uL (ref 0.0–0.1)
Immature Granulocytes: 0 %
Lymphocytes Absolute: 1.6 10*3/uL (ref 0.7–3.1)
Lymphs: 18 %
MCH: 27.3 pg (ref 26.6–33.0)
MCHC: 32.9 g/dL (ref 31.5–35.7)
MCV: 83 fL (ref 79–97)
Monocytes Absolute: 0.5 10*3/uL (ref 0.1–0.9)
Monocytes: 5 %
Neutrophils Absolute: 5.8 10*3/uL (ref 1.4–7.0)
Neutrophils: 66 %
Platelets: 371 10*3/uL (ref 150–450)
RBC: 4.77 x10E6/uL (ref 3.77–5.28)
RDW: 14.5 % (ref 11.7–15.4)
WBC: 8.8 10*3/uL (ref 3.4–10.8)

## 2021-11-24 LAB — COMPREHENSIVE METABOLIC PANEL
ALT: 13 IU/L (ref 0–32)
AST: 18 IU/L (ref 0–40)
Albumin/Globulin Ratio: 1.6 (ref 1.2–2.2)
Albumin: 4.1 g/dL (ref 3.8–4.8)
Alkaline Phosphatase: 73 IU/L (ref 44–121)
BUN/Creatinine Ratio: 14 (ref 12–28)
BUN: 11 mg/dL (ref 8–27)
Bilirubin Total: 0.5 mg/dL (ref 0.0–1.2)
CO2: 22 mmol/L (ref 20–29)
Calcium: 9.7 mg/dL (ref 8.7–10.3)
Chloride: 106 mmol/L (ref 96–106)
Creatinine, Ser: 0.79 mg/dL (ref 0.57–1.00)
Globulin, Total: 2.6 g/dL (ref 1.5–4.5)
Glucose: 96 mg/dL (ref 70–99)
Potassium: 4.7 mmol/L (ref 3.5–5.2)
Sodium: 141 mmol/L (ref 134–144)
Total Protein: 6.7 g/dL (ref 6.0–8.5)
eGFR: 84 mL/min/{1.73_m2} (ref >59)

## 2021-11-24 LAB — LIPID PANEL
Chol/HDL Ratio: 3.9 ratio (ref 0.0–4.4)
Cholesterol, Total: 221 mg/dL — ABNORMAL HIGH (ref 100–199)
HDL: 56 mg/dL (ref >39)
LDL Chol Calc (NIH): 143 mg/dL — ABNORMAL HIGH (ref 0–99)
Triglycerides: 124 mg/dL (ref 0–149)
VLDL Cholesterol Cal: 22 mg/dL (ref 5–40)

## 2021-11-24 LAB — THYROID PANEL WITH TSH
Free Thyroxine Index: 3.4 (ref 1.2–4.9)
T3 Uptake Ratio: 30 % (ref 24–39)
T4, Total: 11.3 ug/dL (ref 4.5–12.0)
TSH: 0.187 u[IU]/mL — ABNORMAL LOW (ref 0.450–4.500)

## 2021-11-24 LAB — HEMOGLOBIN A1C
Est. average glucose Bld gHb Est-mCnc: 114 mg/dL
Hgb A1c MFr Bld: 5.6 % (ref 4.8–5.6)

## 2021-11-24 MED ORDER — LEVOTHYROXINE SODIUM 125 MCG PO TABS: ORAL_TABLET | ORAL | 3 refills | Status: DC

## 2021-11-25 ENCOUNTER — Telehealth (HOSPITAL_BASED_OUTPATIENT_CLINIC_OR_DEPARTMENT_OTHER): Payer: Self-pay | Admitting: Nurse Practitioner

## 2021-11-25 NOTE — Telephone Encounter (Signed)
-----   Message from Tollie Eth, NP sent at 11/24/2021  5:00 PM EDT ----- Please contact patient to schedule lab only follow-up in 8 weeks for repeat TSH level.

## 2021-11-25 NOTE — Telephone Encounter (Signed)
Called pt to schedule repeat labs in 8 weeks. No answer and left voicemail.

## 2021-12-09 DIAGNOSIS — Z6839 Body mass index (BMI) 39.0-39.9, adult: Secondary | ICD-10-CM | POA: Insufficient documentation

## 2021-12-09 DIAGNOSIS — Z0000 Encounter for general adult medical examination without abnormal findings: Secondary | ICD-10-CM | POA: Insufficient documentation

## 2021-12-09 DIAGNOSIS — Z6836 Body mass index (BMI) 36.0-36.9, adult: Secondary | ICD-10-CM | POA: Insufficient documentation

## 2021-12-09 NOTE — Assessment & Plan Note (Signed)
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  Patient has historically been stable on current dose of levothyroxine without need for changes.  Will obtain labs today for monitoring.  No alarm symptoms present at this time.  We will make changes to plan of care based on laboratory findings as needed.  Patient aware if any new symptoms occur to contact the office for follow-up.

## 2021-12-09 NOTE — Progress Notes (Signed)
Orma Render, DNP, AGNP-c Primary Care & Sports Medicine [address]  [place], [place] [address] [phone]  New patient visit   Patient: Kristin Ramirez   DOB: July 17, 1960   61 y.o. Female  MRN: [id number] Visit Date: 11/23/2021  Patient Care Team: Gelisa Tieken, Coralee Pesa, NP as PCP - General (Nurse Practitioner)  Today's Vitals   11/23/21 0934  BP: 117/72  Pulse: 71  SpO2: 97%  Weight: 219 lb (99.3 kg)  Height: _0  (1.651 m)   Body mass index is 36.44 kg/m.   Today's healthcare provider: Orma Render, NP   Chief Complaint  Patient presents with   New Patient (Initial Visit)    Patient presents today to establish care. 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 female who presents today as a new patient to establish care.    Patient endorses the following concerns presently: Hypothyroid - previous tumor removal at about 61 years of age - has been on synthroid since that time - levels have overall been stable - currently on annual monitoring.   She has recently retired and tells me she is enjoying retirement She eats a healthy well balanced diet with more chicken and fish as opposed to red meats.  She stays active.   History reviewed and reveals the following: Past Medical History:  Diagnosis Date   Hyperlipidemia    Thyroid disease    Past Surgical History:  Procedure Laterality Date   ANKLE RECONSTRUCTION  06/2007   left   CHOLECYSTECTOMY     SHOULDER SURGERY     right and left   Family Status  Relation Name Status   Mother  Alive   Father  Deceased   Sister  Alive   Brother  Alive   Family History  Problem Relation Age of Onset   Hypertension Mother    Hypertension Father    Heart disease Father    Bladder Cancer Brother    Social History   Socioeconomic History   Marital status: Divorced    Spouse name: Not on file   Number of  children: 0   Years of education: Not on file   Highest education level: Not on file  Occupational History   Occupation: retired from [employer] Use   Smoking status: Former    Types: Cigarettes    Quit date: 10/17/2009    Years since quitting: 12.1   Smokeless tobacco: Never  Vaping Use   Vaping Use: Never used  Substance and Sexual Activity   Alcohol use: No   Drug use: No   Sexual activity: Not Currently  Other Topics Concern   Not on file  Social History Narrative   Not on file   Social Determinants of Health   Financial Resource Strain: Not on file  Food Insecurity: Not on file  Transportation Needs: Not on file  Physical Activity: Not on file  Stress: Not on file  Social Connections: Not on file   Outpatient Medications Prior to Visit  Medication Sig   Multiple Vitamin (MULTIVITAMIN) tablet Take 1 tablet by mouth daily.   [DISCONTINUED] levothyroxine (SYNTHROID) 125 MCG tablet Take 125 mcg by mouth daily.   [DISCONTINUED] fish oil-omega-3 fatty acids 1000 MG capsule Take 1 g by mouth daily. (Patient not taking: Reported on 11/23/2021)   [DISCONTINUED] Flaxseed Oil OIL 1 tablet by  Does not apply route daily. (Patient not taking: Reported on 11/23/2021)   [DISCONTINUED] levothyroxine (SYNTHROID) 150 MCG tablet Take 150 mcg by mouth daily. (Patient not taking: Reported on 11/23/2021)   [DISCONTINUED] simvastatin (ZOCOR) 40 MG tablet Take 40 mg by mouth every evening. (Patient not taking: Reported on 11/23/2021)   No facility-administered medications prior to visit.   No Known Allergies  There is no immunization history on file for this patient.  Review of Systems All review of systems negative except what is listed in the HPI   Objective    BP 117/72   Pulse 71   Ht _0  (1.651 m)   Wt 219 lb (99.3 kg)   SpO2 97%   BMI 36.44 kg/m  Physical Exam Vitals and nursing note reviewed.  Constitutional:      General: She is not in acute distress.     Appearance: Normal appearance.  Eyes:     Extraocular Movements: Extraocular movements intact.     Conjunctiva/sclera: Conjunctivae normal.     Pupils: Pupils are equal, round, and reactive to light.  Neck:     Vascular: No carotid bruit.  Cardiovascular:     Rate and Rhythm: Normal rate and regular rhythm.     Pulses: Normal pulses.     Heart sounds: Normal heart sounds. No murmur heard. Pulmonary:     Effort: Pulmonary effort is normal.     Breath sounds: Normal breath sounds. No wheezing.  Abdominal:     General: Bowel sounds are normal.     Palpations: Abdomen is soft.  Musculoskeletal:        General: Normal range of motion.     Cervical back: Normal range of motion. No tenderness.     Right lower leg: No edema.     Left lower leg: No edema.  Lymphadenopathy:     Cervical: No cervical adenopathy.  Skin:    General: Skin is warm and dry.     Capillary Refill: Capillary refill takes less than 2 seconds.  Neurological:     General: No focal deficit present.     Mental Status: She is alert and oriented to person, place, and time.  Psychiatric:        Mood and Affect: Mood normal.        Behavior: Behavior normal.        Thought Content: Thought content normal.        Judgment: Judgment normal.     Results for orders placed or performed in visit on 11/23/21  Hemoglobin A1c  Result Value Ref Range   Hgb A1c MFr Bld 5.6 4.8 - 5.6 %   Est. average glucose Bld gHb Est-mCnc 114 mg/dL  Comprehensive metabolic panel  Result Value Ref Range   Glucose 96 70 - 99 mg/dL   BUN 11 8 - 27 mg/dL   Creatinine, Ser 0.79 0.57 - 1.00 mg/dL   eGFR 84 >59 mL/min/1.73   BUN/Creatinine Ratio 14 12 - 28   Sodium 141 134 - 144 mmol/L   Potassium 4.7 3.5 - 5.2 mmol/L   Chloride 106 96 - 106 mmol/L   CO2 22 20 - 29 mmol/L   Calcium 9.7 8.7 - 10.3 mg/dL   Total Protein 6.7 6.0 - 8.5 g/dL   Albumin 4.1 3.8 - 4.8 g/dL   Globulin, Total 2.6 1.5 - 4.5 g/dL   Albumin/Globulin Ratio 1.6 1.2 -  2.2   Bilirubin Total 0.5 0.0 - 1.2 mg/dL   Alkaline Phosphatase  73 44 - 121 IU/L   AST 18 0 - 40 IU/L   ALT 13 0 - 32 IU/L  CBC With Diff/Platelet  Result Value Ref Range   WBC 8.8 3.4 - 10.8 x10E3/uL   RBC 4.77 3.77 - 5.28 x10E6/uL   Hemoglobin 13.0 11.1 - 15.9 g/dL   Hematocrit 39.5 34.0 - 46.6 %   MCV 83 79 - 97 fL   MCH 27.3 26.6 - 33.0 pg   MCHC 32.9 31.5 - 35.7 g/dL   RDW 14.5 11.7 - 15.4 %   Platelets 371 150 - 450 x10E3/uL   Neutrophils 66 Not Estab. %   Lymphs 18 Not Estab. %   Monocytes 5 Not Estab. %   Eos 10 Not Estab. %   Basos 1 Not Estab. %   Neutrophils Absolute 5.8 1.4 - 7.0 x10E3/uL   Lymphocytes Absolute 1.6 0.7 - 3.1 x10E3/uL   Monocytes Absolute 0.5 0.1 - 0.9 x10E3/uL   EOS (ABSOLUTE) 0.9 (H) 0.0 - 0.4 x10E3/uL   Basophils Absolute 0.1 0.0 - 0.2 x10E3/uL   Immature Granulocytes 0 Not Estab. 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  Patient has historically been stable on current dose of levothyroxine without need for changes.  Will obtain labs today for monitoring.  No alarm symptoms present at this time.  We will make changes to plan of care based on laboratory findings as needed.  Patient aware if any new symptoms occur to contact the office for follow-up.      Relevant Medications   levothyroxine (SYNTHROID) 125 MCG tablet   Other  Relevant Orders   Thyroid Panel With TSH (Completed)   Thyroid Panel With TSH   Body mass index (BMI) 36.0-36.9, adult    Elevated BMI today with no alarm symptoms present.  Recommendations for diet and activity provided to patient on after visit summary.  We will also obtain labs today for baseline.      Encounter for annual physical exam   Other Visit Diagnoses     Healthcare maintenance       Relevant Orders   Hemoglobin A1c (Completed)   Comprehensive metabolic panel (Completed)   CBC With Diff/Platelet (Completed)   Lipid panel (Completed)   Thyroid Panel With TSH (Completed)        Return in about 1 year (around 11/24/2022) for CPE today- CPE in 1 year.      Mana Morison, Coralee Pesa, NP, DNP, AGNP-C Primary Care & Sports Medicine at [place]

## 2021-12-09 NOTE — Assessment & Plan Note (Signed)
New patient presenting today.  Physical exam completed with no alarm symptoms present.  Review of health maintenance activities completed. We will work to obtain patient records to update care gaps in chart. Labs performed today.  We will make changes to plan of care as necessary based on laboratory findings.  Plan to follow-up in 1 year for CPE or sooner if needed.

## 2021-12-09 NOTE — Assessment & Plan Note (Signed)
Elevated BMI today with no alarm symptoms present.  Recommendations for diet and activity provided to patient on after visit summary.  We will also obtain labs today for baseline.

## 2022-01-18 ENCOUNTER — Other Ambulatory Visit (HOSPITAL_BASED_OUTPATIENT_CLINIC_OR_DEPARTMENT_OTHER): Payer: Self-pay | Admitting: Nurse Practitioner

## 2022-01-18 ENCOUNTER — Ambulatory Visit (HOSPITAL_BASED_OUTPATIENT_CLINIC_OR_DEPARTMENT_OTHER): Payer: 59

## 2022-01-18 DIAGNOSIS — E890 Postprocedural hypothyroidism: Secondary | ICD-10-CM | POA: Diagnosis not present

## 2022-01-19 LAB — THYROID PANEL WITH TSH
Free Thyroxine Index: 3 (ref 1.2–4.9)
T3 Uptake Ratio: 29 % (ref 24–39)
T4, Total: 10.2 ug/dL (ref 4.5–12.0)
TSH: 0.607 u[IU]/mL (ref 0.450–4.500)

## 2022-06-07 DIAGNOSIS — M1612 Unilateral primary osteoarthritis, left hip: Secondary | ICD-10-CM | POA: Diagnosis not present

## 2022-06-07 DIAGNOSIS — M1712 Unilateral primary osteoarthritis, left knee: Secondary | ICD-10-CM | POA: Diagnosis not present

## 2022-06-07 DIAGNOSIS — M25552 Pain in left hip: Secondary | ICD-10-CM | POA: Diagnosis not present

## 2022-06-07 DIAGNOSIS — M25562 Pain in left knee: Secondary | ICD-10-CM | POA: Diagnosis not present

## 2022-06-20 DIAGNOSIS — M25552 Pain in left hip: Secondary | ICD-10-CM | POA: Diagnosis not present

## 2022-06-20 DIAGNOSIS — M1612 Unilateral primary osteoarthritis, left hip: Secondary | ICD-10-CM | POA: Diagnosis not present

## 2022-07-18 DIAGNOSIS — M1711 Unilateral primary osteoarthritis, right knee: Secondary | ICD-10-CM | POA: Diagnosis not present

## 2022-07-18 DIAGNOSIS — M1612 Unilateral primary osteoarthritis, left hip: Secondary | ICD-10-CM | POA: Diagnosis not present

## 2022-07-18 DIAGNOSIS — M170 Bilateral primary osteoarthritis of knee: Secondary | ICD-10-CM | POA: Diagnosis not present

## 2022-09-19 DIAGNOSIS — M1712 Unilateral primary osteoarthritis, left knee: Secondary | ICD-10-CM | POA: Diagnosis not present

## 2022-09-19 DIAGNOSIS — M1612 Unilateral primary osteoarthritis, left hip: Secondary | ICD-10-CM | POA: Diagnosis not present

## 2022-09-22 DIAGNOSIS — M1612 Unilateral primary osteoarthritis, left hip: Secondary | ICD-10-CM | POA: Diagnosis not present

## 2022-10-21 DIAGNOSIS — M1612 Unilateral primary osteoarthritis, left hip: Secondary | ICD-10-CM | POA: Diagnosis not present

## 2022-10-21 DIAGNOSIS — M1712 Unilateral primary osteoarthritis, left knee: Secondary | ICD-10-CM | POA: Diagnosis not present

## 2022-10-21 DIAGNOSIS — M1711 Unilateral primary osteoarthritis, right knee: Secondary | ICD-10-CM | POA: Diagnosis not present

## 2022-12-05 ENCOUNTER — Encounter: Payer: Self-pay | Admitting: Nurse Practitioner

## 2022-12-05 ENCOUNTER — Encounter (HOSPITAL_BASED_OUTPATIENT_CLINIC_OR_DEPARTMENT_OTHER): Payer: 59 | Admitting: Nurse Practitioner

## 2022-12-05 ENCOUNTER — Ambulatory Visit (INDEPENDENT_AMBULATORY_CARE_PROVIDER_SITE_OTHER): Payer: 59 | Admitting: Nurse Practitioner

## 2022-12-05 VITALS — BP 130/84 | HR 69 | Ht 64.0 in | Wt 224.4 lb

## 2022-12-05 DIAGNOSIS — E890 Postprocedural hypothyroidism: Secondary | ICD-10-CM | POA: Diagnosis not present

## 2022-12-05 DIAGNOSIS — Z1211 Encounter for screening for malignant neoplasm of colon: Secondary | ICD-10-CM

## 2022-12-05 DIAGNOSIS — Z0000 Encounter for general adult medical examination without abnormal findings: Secondary | ICD-10-CM | POA: Diagnosis not present

## 2022-12-05 DIAGNOSIS — Z6836 Body mass index (BMI) 36.0-36.9, adult: Secondary | ICD-10-CM

## 2022-12-05 DIAGNOSIS — Z23 Encounter for immunization: Secondary | ICD-10-CM

## 2022-12-05 MED ORDER — LEVOTHYROXINE SODIUM 125 MCG PO TABS: ORAL_TABLET | ORAL | 3 refills | Status: DC

## 2022-12-05 MED ORDER — ZOSTER VAC RECOMB ADJUVANTED 50 MCG/0.5ML IM SUSR: 0.5000 mL | Freq: Once | INTRAMUSCULAR | 1 refills | Status: AC

## 2022-12-05 NOTE — Progress Notes (Signed)
Shawna Clamp, DNP, AGNP-c [facility] [address] [place], [place] [address] Main Office [phone]  BP 130/84   Pulse 69   Ht 5\' 4"  (1.626 m)   Wt 224 lb 6.4 oz (101.8 kg)   BMI 38.52 kg/m    Subjective:    Patient ID: Kristin Ramirez, female    DOB: 1960/07/08, 62 y.o.   MRN: [id number]  HPI: Kristin Ramirez is a 62 y.o. female presenting on 12/05/2022 for comprehensive medical examination.   Current medical concerns include:none   A comprehensive review of systems was negative.  IMMUNIZATIONS:   Flu: Flu vaccine postponed until flu season Prevnar 13: Prevnar 13 N/A for this patient Prevnar 20: Prevnar 20 N/A for this patient Pneumovax 23: Pneumovax 23 N/A for this patient Vac Shingrix: Shingrix due, prescription provided HPV: HPV N/A for this patient Tetanus:Unclear COVID: COVID completed, documentation needed She has had 2 COVID vaccines  HEALTH MAINTENANCE: Pap Smear HM Status: is up to date Mammogram HM Status: is up to date Colon Cancer Screening HM Status: ordered today Bone Density HM Status: is not applicable for this patient STI Testing HM Status: is not applicable for this patient Lung CT HM Status: is not applicable for this patient  She reports regular vision exams q1-5y: Yes  She reports regular dental exams q 33m:  Yes  The patient eats a regular, healthy diet. She endorses exercise and/or activity of: working on increasing. Chronic arthritis limits her ability to move as well as she would like.    Most Recent Depression Screen:     12/05/2022   10:19 AM  Depression screen PHQ 2/9  Decreased Interest 0  Down, Depressed, Hopeless 0  PHQ - 2 Score 0   Most Recent Anxiety Screen:      No data to display         Most Recent Fall Screen:    12/05/2022   10:18 AM  Fall Risk   Falls in the past year? 0  Number falls in past yr: 0  Injury with Fall? 0  Risk for fall due to : No Fall Risks  Follow up Falls evaluation  completed    Past medical history, surgical history, medications, allergies, family history and social history reviewed with patient today and changes made to appropriate areas of the chart.  Past Medical History:  Past Medical History:  Diagnosis Date   Hyperlipidemia    Thyroid disease    Medications:  Current Outpatient Medications on File Prior to Visit  Medication Sig   diclofenac (VOLTAREN) 75 MG EC tablet Take 75 mg by mouth 2 (two) times daily.   Multiple Vitamin (MULTIVITAMIN) tablet Take 1 tablet by mouth daily.   No current facility-administered medications on file prior to visit.   Surgical History:  Past Surgical History:  Procedure Laterality Date   ANKLE RECONSTRUCTION  06/2007   left   CHOLECYSTECTOMY     SHOULDER SURGERY     right and left   Allergies:  No Known Allergies Family History:  Family History  Problem Relation Age of Onset   Hypertension Mother    Hypertension Father    Heart disease Father    Bladder Cancer Brother        Objective:    BP 130/84   Pulse 69   Ht 5\' 4"  (1.626 m)   Wt 224 lb 6.4 oz (101.8 kg)   BMI 38.52 kg/m   Wt Readings from Last 3 Encounters:  12/05/22  224 lb 6.4 oz (101.8 kg)  11/23/21 219 lb (99.3 kg)  09/14/16 195 lb (88.5 kg)    Physical Exam Vitals and nursing note reviewed.  Constitutional:      General: She is not in acute distress.    Appearance: Normal appearance.  HENT:     Head: Normocephalic and atraumatic.     Right Ear: Hearing, tympanic membrane, ear canal and external ear normal.     Left Ear: Hearing, tympanic membrane, ear canal and external ear normal.     Nose: Nose normal.     Right Sinus: No maxillary sinus tenderness or frontal sinus tenderness.     Left Sinus: No maxillary sinus tenderness or frontal sinus tenderness.     Mouth/Throat:     Lips: Pink.     Mouth: Mucous membranes are moist.     Pharynx: Oropharynx is clear.  Eyes:     General: Lids are normal. Vision grossly  intact.     Extraocular Movements: Extraocular movements intact.     Conjunctiva/sclera: Conjunctivae normal.     Pupils: Pupils are equal, round, and reactive to light.     Funduscopic exam:    Right eye: Red reflex present.        Left eye: Red reflex present.    Visual Fields: Right eye visual fields normal and left eye visual fields normal.  Neck:     Thyroid: No thyromegaly.     Vascular: No carotid bruit.  Cardiovascular:     Rate and Rhythm: Normal rate and regular rhythm.     Chest Wall: PMI is not displaced.     Pulses: Normal pulses.          Dorsalis pedis pulses are 2+ on the right side and 2+ on the left side.       Posterior tibial pulses are 2+ on the right side and 2+ on the left side.     Heart sounds: Normal heart sounds. No murmur heard. Pulmonary:     Effort: Pulmonary effort is normal. No respiratory distress.     Breath sounds: Normal breath sounds.  Abdominal:     General: Abdomen is flat. Bowel sounds are normal. There is no distension.     Palpations: Abdomen is soft. There is no hepatomegaly, splenomegaly or mass.     Tenderness: There is no abdominal tenderness. There is no right CVA tenderness, left CVA tenderness, guarding or rebound.  Musculoskeletal:        General: Normal range of motion.     Cervical back: Full passive range of motion without pain, normal range of motion and neck supple. No tenderness.     Right lower leg: No edema.     Left lower leg: No edema.  Feet:     Left foot:     Toenail Condition: Left toenails are normal.  Lymphadenopathy:     Cervical: No cervical adenopathy.     Upper Body:     Right upper body: No supraclavicular adenopathy.     Left upper body: No supraclavicular adenopathy.  Skin:    General: Skin is warm and dry.     Capillary Refill: Capillary refill takes less than 2 seconds.     Nails: There is no clubbing.  Neurological:     General: No focal deficit present.     Mental Status: She is alert and oriented  to person, place, and time.     GCS: GCS eye subscore is 4. GCS verbal subscore  is 5. GCS motor subscore is 6.     Sensory: Sensation is intact.     Motor: Motor function is intact.     Coordination: Coordination is intact.     Gait: Gait is intact.     Deep Tendon Reflexes: Reflexes are normal and symmetric.  Psychiatric:        Attention and Perception: Attention normal.        Mood and Affect: Mood normal.        Speech: Speech normal.        Behavior: Behavior normal. Behavior is cooperative.        Thought Content: Thought content normal.        Cognition and Memory: Cognition and memory normal.        Judgment: Judgment normal.     Results for orders placed or performed in visit on 12/05/22  CBC with Differential/Platelet  Result Value Ref Range   WBC 7.1 3.4 - 10.8 x10E3/uL   RBC 4.69 3.77 - 5.28 x10E6/uL   Hemoglobin 12.4 11.1 - 15.9 g/dL   Hematocrit 60.4 54.0 - 46.6 %   MCV 86 79 - 97 fL   MCH 26.4 (L) 26.6 - 33.0 pg   MCHC 30.6 (L) 31.5 - 35.7 g/dL   RDW 98.1 (H) 19.1 - 47.8 %   Platelets 405 150 - 450 x10E3/uL   Neutrophils 65 Not Estab. %   Lymphs 23 Not Estab. %   Monocytes 7 Not Estab. %   Eos 3 Not Estab. %   Basos 1 Not Estab. %   Neutrophils Absolute 4.5 1.4 - 7.0 x10E3/uL   Lymphocytes Absolute 1.7 0.7 - 3.1 x10E3/uL   Monocytes Absolute 0.5 0.1 - 0.9 x10E3/uL   EOS (ABSOLUTE) 0.2 0.0 - 0.4 x10E3/uL   Basophils Absolute 0.1 0.0 - 0.2 x10E3/uL   Immature Granulocytes 1 Not Estab. %   Immature Grans (Abs) 0.1 0.0 - 0.1 x10E3/uL  CMP14+EGFR  Result Value Ref Range   Glucose 94 70 - 99 mg/dL   BUN 12 8 - 27 mg/dL   Creatinine, Ser 2.95 0.57 - 1.00 mg/dL   eGFR 83 >62 ZH/YQM/5.78   BUN/Creatinine Ratio 15 12 - 28   Sodium 143 134 - 144 mmol/L   Potassium 5.2 3.5 - 5.2 mmol/L   Chloride 107 (H) 96 - 106 mmol/L   CO2 23 20 - 29 mmol/L   Calcium 9.9 8.7 - 10.3 mg/dL   Total Protein 6.7 6.0 - 8.5 g/dL   Albumin 4.3 3.9 - 4.9 g/dL   Globulin, Total 2.4  1.5 - 4.5 g/dL   Bilirubin Total 0.4 0.0 - 1.2 mg/dL   Alkaline Phosphatase 58 44 - 121 IU/L   AST 18 0 - 40 IU/L   ALT 18 0 - 32 IU/L  Hemoglobin A1c  Result Value Ref Range   Hgb A1c MFr Bld 5.7 (H) 4.8 - 5.6 %   Est. average glucose Bld gHb Est-mCnc 117 mg/dL  Lipid panel  Result Value Ref Range   Cholesterol, Total 243 (H) 100 - 199 mg/dL   Triglycerides 469 (H) 0 - 149 mg/dL   HDL 53 >62 mg/dL   VLDL Cholesterol Cal 28 5 - 40 mg/dL   LDL Chol Calc (NIH) 952 (H) 0 - 99 mg/dL   Chol/HDL Ratio 4.6 (H) 0.0 - 4.4 ratio  TSH  Result Value Ref Range   TSH 1.180 0.450 - 4.500 uIU/mL  Cologuard  Result Value Ref Range  COLOGUARD Negative Negative         Assessment & Plan:   Problem List Items Addressed This Visit     Hypothyroidism    Currently managed with levothyroxine . No alarm symptoms are present at this time.  Plan: - Labs pending.  - Continue current therapy- will make changes based on lab findings.       Relevant Medications   levothyroxine (SYNTHROID) 125 MCG tablet   Other Relevant Orders   CBC with Differential/Platelet (Completed)   CMP14+EGFR (Completed)   Hemoglobin A1c (Completed)   Lipid panel (Completed)   TSH (Completed)   Body mass index (BMI) 36.0-36.9, adult    Recommend diet and exercise management. Labs pending.       Relevant Medications   levothyroxine (SYNTHROID) 125 MCG tablet   Other Relevant Orders   CBC with Differential/Platelet (Completed)   CMP14+EGFR (Completed)   Hemoglobin A1c (Completed)   Lipid panel (Completed)   TSH (Completed)   Encounter for annual physical exam - Primary    CPE completed today.   Labs ordered. Will make changes as necessary based on results.  Review of HM activities and recommendations discussed and provided on AVS. Anticipatory guidance, diet, and exercise recommendations provided.  Medications, allergies, and hx reviewed and updated as necessary.  Plan to f/u with CPE in 1 year or  sooner for acute/chronic health needs as directed.        Relevant Medications   levothyroxine (SYNTHROID) 125 MCG tablet   Other Relevant Orders   CBC with Differential/Platelet (Completed)   CMP14+EGFR (Completed)   Hemoglobin A1c (Completed)   Lipid panel (Completed)   TSH (Completed)   Other Visit Diagnoses     Need for shingles vaccine       Screening for colon cancer       Relevant Orders   Cologuard (Completed)          Follow up plan: Return in about 1 year (around 12/05/2023) for CPE.  NEXT PREVENTATIVE PHYSICAL DUE IN 1 YEAR.  PATIENT COUNSELING PROVIDED FOR ALL ADULT PATIENTS: A well balanced diet low in saturated fats, cholesterol, and moderation in carbohydrates.  This can be as simple as monitoring portion sizes and cutting back on sugary beverages such as soda and juice to start with.    Daily water consumption of at least 64 ounces.  Physical activity at least 180 minutes per week.  If just starting out, start 10 minutes a day and work your way up.   This can be as simple as taking the stairs instead of the elevator and walking 2-3 laps around the office  purposefully every day.   STD protection, partner selection, and regular testing if high risk.  Limited consumption of alcoholic beverages if alcohol is consumed. For men, I recommend no more than 14 alcoholic beverages per week, spread out throughout the week (max 2 per day). Avoid "binge" drinking or consuming large quantities of alcohol in one setting.  Please let me know if you feel you may need help with reduction or quitting alcohol consumption.   Avoidance of nicotine, if used. Please let me know if you feel you may need help with reduction or quitting nicotine use.   Daily mental health attention. This can be in the form of 5 minute daily meditation, prayer, journaling, yoga, reflection, etc.  Purposeful attention to your emotions and mental state can significantly improve your overall  wellbeing  and  Health.  Please know that I am  here to help you with all of your health care goals and am happy to work with you to find a solution that works best for you.  The greatest advice I have received with any changes in life are to take it one step at a time, that even means if all you can focus on is the next 60 seconds, then do that and celebrate your victories.  With any changes in life, you will have set backs, and that is OK. The important thing to remember is, if you have a set back, it is not a failure, it is an opportunity to try again! Screening Testing Mammogram Every 1 -2 years based on history and risk factors Starting at age 25 Pap Smear Ages 21-39 every 3 years Ages 89-65 every 5 years with HPV testing More frequent testing may be required based on results and history Colon Cancer Screening Every 1-10 years based on test performed, risk factors, and history Starting at age 60 Bone Density Screening Every 2-10 years based on history Starting at age 28 for women Recommendations for men differ based on medication usage, history, and risk factors AAA Screening One time ultrasound Men 54-44 years old who have every smoked Lung Cancer Screening Low Dose Lung CT every 12 months Age 12-80 years with a 30 pack-year smoking history who still smoke or who have quit within the last 15 years   Screening Labs Routine  Labs: Complete Blood Count (CBC), Complete Metabolic Panel (CMP), Cholesterol (Lipid Panel) Every 6-12 months based on history and medications May be recommended more frequently based on current conditions or previous results Hemoglobin A1c Lab Every 3-12 months based on history and previous results Starting at age 47 or earlier with diagnosis of diabetes, high cholesterol, BMI >26, and/or risk factors Frequent monitoring for patients with diabetes to ensure blood sugar control Thyroid Panel (TSH) Every 6 months based on history, symptoms, and risk  factors May be repeated more often if on medication HIV One time testing for all patients 42 and older May be repeated more frequently for patients with increased risk factors or exposure Hepatitis C One time testing for all patients 8 and older May be repeated more frequently for patients with increased risk factors or exposure Gonorrhea, Chlamydia Every 12 months for all sexually active persons 13-24 years Additional monitoring may be recommended for those who are considered high risk or who have symptoms Every 12 months for any woman on birth control, regardless of sexual activity PSA Men 2-68 years old with risk factors Additional screening may be recommended from age 18-69 based on risk factors, symptoms, and history  Vaccine Recommendations Tetanus Booster All adults every 10 years Flu Vaccine All patients 6 months and older every year COVID Vaccine All patients 12 years and older Initial dosing with booster May recommend additional booster based on age and health history HPV Vaccine 2 doses all patients age 72-26 Dosing may be considered for patients over 26 Shingles Vaccine (Shingrix) 2 doses all adults 55 years and older Pneumonia (Pneumovax 29) All adults 65 years and older May recommend earlier dosing based on health history One year apart from Prevnar 72 Pneumonia (Prevnar 62) All adults 65 years and older Dosed 1 year after Pneumovax 23 Pneumonia (Prevnar 20) One time alternative to the two dosing of 13 and 23 For all adults with initial dose of 23, 20 is recommended 1 year later For all adults with initial dose of 13, 23 is still recommended as second  option 1 year later

## 2022-12-05 NOTE — Patient Instructions (Signed)
WEIGHT LOSS PLANNING Your progress today shows:     12/05/2022   10:19 AM 11/23/2021    9:34 AM 09/14/2016    3:18 PM  Vitals with BMI  Height 5\' 4"  5\' 5"  5\' 5"   Weight 224 lbs 6 oz 219 lbs 195 lbs  BMI 38.5 36.44 32.5  Systolic 130 117 469  Diastolic 84 72 81  Pulse 69 71 54    For best management of weight, it is vital to balance intake versus output. This means the number of calories burned per day must be less than the calories you take in with food and drink.   I recommend trying to follow a diet with the following: Calories: 1200-1500 calories per day Carbohydrates: 150-180 grams of carbohydrates per day  Why: Gives your body enough "quick fuel" for cells to maintain normal function without sending them into starvation mode.  Protein: At least 90 grams of protein per day- 30 grams with each meal Why: Protein takes longer and uses more energy than carbohydrates to break down for fuel. The carbohydrates in your meals serves as quick energy sources and proteins help use some of that extra quick energy to break down to produce long term energy. This helps you not feel hungry as quickly and protein breakdown burns calories.  Water: Drink AT LEAST 64 ounces of water per day  Why: Water is essential to healthy metabolism. Water helps to fill the stomach and keep you fuller longer. Water is required for healthy digestion and filtering of waste in the body.  Fat: Limit fats in your diet- when choosing fats, choose foods with lower fats content such as lean meats (chicken, fish, [place]).  Why: Increased fat intake leads to storage "for later". Once you burn your carbohydrate energy, your body goes into fat and protein breakdown mode to help you loose weight.  Cholesterol: Fats and oils that are LIQUID at room temperature are best. Choose vegetable oils (olive oil, avocado oil, nuts). Avoid fats that are SOLID at room temperature (animal fats, processed meats). Healthy fats are often found in  whole grains, beans, nuts, seeds, and berries.  Why: Elevated cholesterol levels lead to build up of cholesterol on the inside of your blood vessels. This will eventually cause the blood vessels to become hard and can lead to high blood pressure and damage to your organs. When the blood flow is reduced, but the pressure is high from cholesterol buildup, parts of the cholesterol can break off and form clots that can go to the brain or heart leading to a stroke or heart attack.  Fiber: Increase amount of SOLUBLE the fiber in your diet. This helps to fill you up, lowers cholesterol, and helps with digestion. Some foods high in soluble fiber are oats, peas, beans, apples, carrots, barley, and citrus fruits.   Why: Fiber fills you up, helps remove excess cholesterol, and aids in healthy digestion which are all very important in weight management.   I recommend the following as a minimum activity routine: Purposeful walk or other physical activity at least 20 minutes every single day. This means purposefully taking a walk, jog, bike, swim, treadmill, elliptical, dance, etc.  This activity should be ABOVE your normal daily activities, such as walking at work. Goal exercise should be at least 150 minutes a week- work your way up to this.   Heart Rate: Your maximum exercise heart rate should be 220 - Your Age in Years. When exercising, get your heart rate up,  but avoid going over the maximum targeted heart rate.  60-70% of your maximum heart rate is where you tend to burn the most fat. To find this number:  220 - Age In Years= Max HR  Max HR x 0.6 (or 0.7) = Fat Burning HR The Fat Burning HR is your goal heart rate while working out to burn the most fat.  NEVER exercise to the point your feel lightheaded, weak, nauseated, dizzy. If you experience ANY of these symptoms- STOP exercise! Allow yourself to cool down and your heart rate to come down. Then restart slower next time.  If at ANY TIME you feel chest  pain or chest pressure during exercise, STOP IMMEDIATELY and seek medical attention.

## 2022-12-06 LAB — CBC WITH DIFFERENTIAL/PLATELET
Basophils Absolute: 0.1 10*3/uL (ref 0.0–0.2)
Basos: 1 %
EOS (ABSOLUTE): 0.2 10*3/uL (ref 0.0–0.4)
Eos: 3 %
Hematocrit: 40.5 % (ref 34.0–46.6)
Hemoglobin: 12.4 g/dL (ref 11.1–15.9)
Immature Grans (Abs): 0.1 10*3/uL (ref 0.0–0.1)
Immature Granulocytes: 1 %
Lymphocytes Absolute: 1.7 10*3/uL (ref 0.7–3.1)
Lymphs: 23 %
MCH: 26.4 pg — ABNORMAL LOW (ref 26.6–33.0)
MCHC: 30.6 g/dL — ABNORMAL LOW (ref 31.5–35.7)
MCV: 86 fL (ref 79–97)
Monocytes Absolute: 0.5 10*3/uL (ref 0.1–0.9)
Monocytes: 7 %
Neutrophils Absolute: 4.5 10*3/uL (ref 1.4–7.0)
Neutrophils: 65 %
Platelets: 405 10*3/uL (ref 150–450)
RBC: 4.69 x10E6/uL (ref 3.77–5.28)
RDW: 15.9 % — ABNORMAL HIGH (ref 11.7–15.4)
WBC: 7.1 10*3/uL (ref 3.4–10.8)

## 2022-12-06 LAB — CMP14+EGFR
ALT: 18 IU/L (ref 0–32)
AST: 18 IU/L (ref 0–40)
Albumin: 4.3 g/dL (ref 3.9–4.9)
Alkaline Phosphatase: 58 IU/L (ref 44–121)
BUN/Creatinine Ratio: 15 (ref 12–28)
BUN: 12 mg/dL (ref 8–27)
Bilirubin Total: 0.4 mg/dL (ref 0.0–1.2)
CO2: 23 mmol/L (ref 20–29)
Calcium: 9.9 mg/dL (ref 8.7–10.3)
Chloride: 107 mmol/L — ABNORMAL HIGH (ref 96–106)
Creatinine, Ser: 0.79 mg/dL (ref 0.57–1.00)
Globulin, Total: 2.4 g/dL (ref 1.5–4.5)
Glucose: 94 mg/dL (ref 70–99)
Potassium: 5.2 mmol/L (ref 3.5–5.2)
Sodium: 143 mmol/L (ref 134–144)
Total Protein: 6.7 g/dL (ref 6.0–8.5)
eGFR: 83 mL/min/{1.73_m2} (ref >59)

## 2022-12-06 LAB — LIPID PANEL
Chol/HDL Ratio: 4.6 ratio — ABNORMAL HIGH (ref 0.0–4.4)
Cholesterol, Total: 243 mg/dL — ABNORMAL HIGH (ref 100–199)
HDL: 53 mg/dL (ref >39)
LDL Chol Calc (NIH): 162 mg/dL — ABNORMAL HIGH (ref 0–99)
Triglycerides: 152 mg/dL — ABNORMAL HIGH (ref 0–149)
VLDL Cholesterol Cal: 28 mg/dL (ref 5–40)

## 2022-12-06 LAB — HEMOGLOBIN A1C
Est. average glucose Bld gHb Est-mCnc: 117 mg/dL
Hgb A1c MFr Bld: 5.7 % — ABNORMAL HIGH (ref 4.8–5.6)

## 2022-12-06 LAB — TSH: TSH: 1.18 u[IU]/mL (ref 0.450–4.500)

## 2022-12-26 DIAGNOSIS — Z1211 Encounter for screening for malignant neoplasm of colon: Secondary | ICD-10-CM | POA: Diagnosis not present

## 2023-01-01 LAB — COLOGUARD: COLOGUARD: NEGATIVE

## 2023-01-10 NOTE — Assessment & Plan Note (Signed)
CPE completed today.   Labs ordered. Will make changes as necessary based on results.  Review of HM activities and recommendations discussed and provided on AVS Anticipatory guidance, diet, and exercise recommendations provided.  Medications, allergies, and hx reviewed and updated as necessary.  Plan to f/u with CPE in 1 year or sooner for acute/chronic health needs as directed.   

## 2023-01-10 NOTE — Assessment & Plan Note (Signed)
Currently managed with levothyroxine . No alarm symptoms are present at this time.  Plan: - Labs pending.  - Continue current therapy- will make changes based on lab findings.

## 2023-01-10 NOTE — Assessment & Plan Note (Signed)
Recommend diet and exercise management. Labs pending.

## 2023-01-12 DIAGNOSIS — M170 Bilateral primary osteoarthritis of knee: Secondary | ICD-10-CM | POA: Diagnosis not present

## 2023-01-18 DIAGNOSIS — M170 Bilateral primary osteoarthritis of knee: Secondary | ICD-10-CM | POA: Diagnosis not present

## 2023-01-24 DIAGNOSIS — M170 Bilateral primary osteoarthritis of knee: Secondary | ICD-10-CM | POA: Diagnosis not present

## 2023-02-01 DIAGNOSIS — M170 Bilateral primary osteoarthritis of knee: Secondary | ICD-10-CM | POA: Diagnosis not present

## 2023-02-27 DIAGNOSIS — M170 Bilateral primary osteoarthritis of knee: Secondary | ICD-10-CM | POA: Diagnosis not present

## 2023-04-07 DIAGNOSIS — M170 Bilateral primary osteoarthritis of knee: Secondary | ICD-10-CM | POA: Diagnosis not present

## 2023-04-07 DIAGNOSIS — M25552 Pain in left hip: Secondary | ICD-10-CM | POA: Diagnosis not present

## 2023-04-13 ENCOUNTER — Telehealth: Payer: Self-pay | Admitting: Nurse Practitioner

## 2023-04-13 DIAGNOSIS — G8929 Other chronic pain: Secondary | ICD-10-CM | POA: Diagnosis not present

## 2023-04-13 DIAGNOSIS — M25561 Pain in right knee: Secondary | ICD-10-CM | POA: Diagnosis not present

## 2023-04-13 DIAGNOSIS — M174 Other bilateral secondary osteoarthritis of knee: Secondary | ICD-10-CM | POA: Diagnosis not present

## 2023-04-13 DIAGNOSIS — M25562 Pain in left knee: Secondary | ICD-10-CM | POA: Diagnosis not present

## 2023-04-13 DIAGNOSIS — Z419 Encounter for procedure for purposes other than remedying health state, unspecified: Secondary | ICD-10-CM

## 2023-04-13 NOTE — Telephone Encounter (Signed)
Pt dropped off a surgery clearance form to be filled out she says she is trying to get it filled out soon and faxed back because the doctor opened up a day this year to be able to do it and she wants to make sure she can get that day. Was put in Sara's folder and she says if she needs an appointment to give her a call.

## 2023-04-14 NOTE — Telephone Encounter (Signed)
She had a physical exam in June. She will have to have new labs completed as these are no longer valid per her surgeons requirements, but based on her last physical exam there really is no reason that she could not be medically cleared from my standpoint. Pending the repeat labs, I am fine completing the paperwork and someone scheduling her to come in for the lab work.   Labs needed: CBC, CMP, A1c orders are in

## 2023-04-14 NOTE — Telephone Encounter (Signed)
Called back this morning to check the status of the form. Pt is scheduled for 11/7. She is upset bc this will make her miss the appt for her knee replacement.

## 2023-04-17 ENCOUNTER — Institutional Professional Consult (permissible substitution): Payer: 59 | Admitting: Nurse Practitioner

## 2023-04-17 ENCOUNTER — Other Ambulatory Visit: Payer: 59

## 2023-04-17 DIAGNOSIS — Z419 Encounter for procedure for purposes other than remedying health state, unspecified: Secondary | ICD-10-CM | POA: Diagnosis not present

## 2023-04-18 ENCOUNTER — Institutional Professional Consult (permissible substitution): Payer: 59 | Admitting: Nurse Practitioner

## 2023-04-18 LAB — CBC WITH DIFFERENTIAL/PLATELET
Basophils Absolute: 0.1 10*3/uL (ref 0.0–0.2)
Basos: 1 %
EOS (ABSOLUTE): 0.6 10*3/uL — ABNORMAL HIGH (ref 0.0–0.4)
Eos: 9 %
Hematocrit: 41 % (ref 34.0–46.6)
Hemoglobin: 13.2 g/dL (ref 11.1–15.9)
Immature Grans (Abs): 0 10*3/uL (ref 0.0–0.1)
Immature Granulocytes: 0 %
Lymphocytes Absolute: 1.5 10*3/uL (ref 0.7–3.1)
Lymphs: 22 %
MCH: 28.6 pg (ref 26.6–33.0)
MCHC: 32.2 g/dL (ref 31.5–35.7)
MCV: 89 fL (ref 79–97)
Monocytes Absolute: 0.5 10*3/uL (ref 0.1–0.9)
Monocytes: 7 %
Neutrophils Absolute: 4.3 10*3/uL (ref 1.4–7.0)
Neutrophils: 61 %
Platelets: 374 10*3/uL (ref 150–450)
RBC: 4.62 x10E6/uL (ref 3.77–5.28)
RDW: 13.4 % (ref 11.7–15.4)
WBC: 7 10*3/uL (ref 3.4–10.8)

## 2023-04-18 LAB — COMPREHENSIVE METABOLIC PANEL
ALT: 15 [IU]/L (ref 0–32)
AST: 15 [IU]/L (ref 0–40)
Albumin: 4.4 g/dL (ref 3.9–4.9)
Alkaline Phosphatase: 64 [IU]/L (ref 44–121)
BUN/Creatinine Ratio: 16 (ref 12–28)
BUN: 18 mg/dL (ref 8–27)
Bilirubin Total: 0.4 mg/dL (ref 0.0–1.2)
CO2: 21 mmol/L (ref 20–29)
Calcium: 9.7 mg/dL (ref 8.7–10.3)
Chloride: 106 mmol/L (ref 96–106)
Creatinine, Ser: 1.11 mg/dL — ABNORMAL HIGH (ref 0.57–1.00)
Globulin, Total: 2.4 g/dL (ref 1.5–4.5)
Glucose: 95 mg/dL (ref 70–99)
Potassium: 5 mmol/L (ref 3.5–5.2)
Sodium: 141 mmol/L (ref 134–144)
Total Protein: 6.8 g/dL (ref 6.0–8.5)
eGFR: 56 mL/min/{1.73_m2} — ABNORMAL LOW (ref >59)

## 2023-04-18 LAB — HEMOGLOBIN A1C
Est. average glucose Bld gHb Est-mCnc: 120 mg/dL
Hgb A1c MFr Bld: 5.8 % — ABNORMAL HIGH (ref 4.8–5.6)

## 2023-04-20 ENCOUNTER — Institutional Professional Consult (permissible substitution): Payer: 59 | Admitting: Nurse Practitioner

## 2023-04-25 ENCOUNTER — Other Ambulatory Visit: Payer: Self-pay

## 2023-04-25 ENCOUNTER — Other Ambulatory Visit: Payer: Self-pay | Admitting: Nurse Practitioner

## 2023-04-25 DIAGNOSIS — R944 Abnormal results of kidney function studies: Secondary | ICD-10-CM | POA: Insufficient documentation

## 2023-04-25 DIAGNOSIS — R7989 Other specified abnormal findings of blood chemistry: Secondary | ICD-10-CM

## 2023-04-25 DIAGNOSIS — R7303 Prediabetes: Secondary | ICD-10-CM | POA: Insufficient documentation

## 2023-05-01 DIAGNOSIS — G8929 Other chronic pain: Secondary | ICD-10-CM | POA: Diagnosis not present

## 2023-05-01 DIAGNOSIS — M25562 Pain in left knee: Secondary | ICD-10-CM | POA: Diagnosis not present

## 2023-05-01 DIAGNOSIS — M25561 Pain in right knee: Secondary | ICD-10-CM | POA: Diagnosis not present

## 2023-05-10 DIAGNOSIS — M1711 Unilateral primary osteoarthritis, right knee: Secondary | ICD-10-CM | POA: Diagnosis not present

## 2023-05-10 DIAGNOSIS — G8918 Other acute postprocedural pain: Secondary | ICD-10-CM | POA: Diagnosis not present

## 2023-05-18 DIAGNOSIS — M25661 Stiffness of right knee, not elsewhere classified: Secondary | ICD-10-CM | POA: Diagnosis not present

## 2023-05-18 DIAGNOSIS — G8929 Other chronic pain: Secondary | ICD-10-CM | POA: Diagnosis not present

## 2023-05-18 DIAGNOSIS — M25561 Pain in right knee: Secondary | ICD-10-CM | POA: Diagnosis not present

## 2023-05-18 DIAGNOSIS — Z7409 Other reduced mobility: Secondary | ICD-10-CM | POA: Diagnosis not present

## 2023-05-18 DIAGNOSIS — Z96651 Presence of right artificial knee joint: Secondary | ICD-10-CM | POA: Diagnosis not present

## 2023-05-18 DIAGNOSIS — Z789 Other specified health status: Secondary | ICD-10-CM | POA: Diagnosis not present

## 2023-05-18 DIAGNOSIS — M25562 Pain in left knee: Secondary | ICD-10-CM | POA: Diagnosis not present

## 2023-05-23 DIAGNOSIS — Z7409 Other reduced mobility: Secondary | ICD-10-CM | POA: Diagnosis not present

## 2023-05-23 DIAGNOSIS — R262 Difficulty in walking, not elsewhere classified: Secondary | ICD-10-CM | POA: Diagnosis not present

## 2023-05-23 DIAGNOSIS — M25561 Pain in right knee: Secondary | ICD-10-CM | POA: Diagnosis not present

## 2023-05-23 DIAGNOSIS — M25661 Stiffness of right knee, not elsewhere classified: Secondary | ICD-10-CM | POA: Diagnosis not present

## 2023-05-23 DIAGNOSIS — Z96651 Presence of right artificial knee joint: Secondary | ICD-10-CM | POA: Diagnosis not present

## 2023-05-23 DIAGNOSIS — M25461 Effusion, right knee: Secondary | ICD-10-CM | POA: Diagnosis not present

## 2023-05-25 DIAGNOSIS — M25461 Effusion, right knee: Secondary | ICD-10-CM | POA: Diagnosis not present

## 2023-05-25 DIAGNOSIS — Z96651 Presence of right artificial knee joint: Secondary | ICD-10-CM | POA: Diagnosis not present

## 2023-05-25 DIAGNOSIS — M25561 Pain in right knee: Secondary | ICD-10-CM | POA: Diagnosis not present

## 2023-05-25 DIAGNOSIS — Z7409 Other reduced mobility: Secondary | ICD-10-CM | POA: Diagnosis not present

## 2023-05-25 DIAGNOSIS — R262 Difficulty in walking, not elsewhere classified: Secondary | ICD-10-CM | POA: Diagnosis not present

## 2023-05-25 DIAGNOSIS — M25661 Stiffness of right knee, not elsewhere classified: Secondary | ICD-10-CM | POA: Diagnosis not present

## 2023-05-30 DIAGNOSIS — R262 Difficulty in walking, not elsewhere classified: Secondary | ICD-10-CM | POA: Diagnosis not present

## 2023-05-30 DIAGNOSIS — Z7409 Other reduced mobility: Secondary | ICD-10-CM | POA: Diagnosis not present

## 2023-05-30 DIAGNOSIS — M25561 Pain in right knee: Secondary | ICD-10-CM | POA: Diagnosis not present

## 2023-05-30 DIAGNOSIS — M25461 Effusion, right knee: Secondary | ICD-10-CM | POA: Diagnosis not present

## 2023-05-30 DIAGNOSIS — Z96651 Presence of right artificial knee joint: Secondary | ICD-10-CM | POA: Diagnosis not present

## 2023-05-30 DIAGNOSIS — M25661 Stiffness of right knee, not elsewhere classified: Secondary | ICD-10-CM | POA: Diagnosis not present

## 2023-06-08 ENCOUNTER — Other Ambulatory Visit: Payer: 59

## 2023-06-08 DIAGNOSIS — R262 Difficulty in walking, not elsewhere classified: Secondary | ICD-10-CM | POA: Diagnosis not present

## 2023-06-08 DIAGNOSIS — M25661 Stiffness of right knee, not elsewhere classified: Secondary | ICD-10-CM | POA: Diagnosis not present

## 2023-06-08 DIAGNOSIS — M25461 Effusion, right knee: Secondary | ICD-10-CM | POA: Diagnosis not present

## 2023-06-08 DIAGNOSIS — M25561 Pain in right knee: Secondary | ICD-10-CM | POA: Diagnosis not present

## 2023-06-08 DIAGNOSIS — Z96651 Presence of right artificial knee joint: Secondary | ICD-10-CM | POA: Diagnosis not present

## 2023-06-08 DIAGNOSIS — Z7409 Other reduced mobility: Secondary | ICD-10-CM | POA: Diagnosis not present

## 2023-06-15 DIAGNOSIS — M25552 Pain in left hip: Secondary | ICD-10-CM | POA: Diagnosis not present

## 2023-06-15 DIAGNOSIS — Z96659 Presence of unspecified artificial knee joint: Secondary | ICD-10-CM | POA: Diagnosis not present

## 2023-06-22 DIAGNOSIS — M25552 Pain in left hip: Secondary | ICD-10-CM | POA: Diagnosis not present

## 2023-06-27 DIAGNOSIS — M1612 Unilateral primary osteoarthritis, left hip: Secondary | ICD-10-CM | POA: Diagnosis not present

## 2023-06-27 DIAGNOSIS — M25552 Pain in left hip: Secondary | ICD-10-CM | POA: Diagnosis not present

## 2023-06-30 DIAGNOSIS — M1612 Unilateral primary osteoarthritis, left hip: Secondary | ICD-10-CM | POA: Diagnosis not present

## 2023-07-13 DIAGNOSIS — R2689 Other abnormalities of gait and mobility: Secondary | ICD-10-CM | POA: Diagnosis not present

## 2023-07-13 DIAGNOSIS — Z96642 Presence of left artificial hip joint: Secondary | ICD-10-CM | POA: Diagnosis not present

## 2023-07-13 DIAGNOSIS — M25652 Stiffness of left hip, not elsewhere classified: Secondary | ICD-10-CM | POA: Diagnosis not present

## 2023-07-13 DIAGNOSIS — R6889 Other general symptoms and signs: Secondary | ICD-10-CM | POA: Diagnosis not present

## 2023-07-13 DIAGNOSIS — R29898 Other symptoms and signs involving the musculoskeletal system: Secondary | ICD-10-CM | POA: Diagnosis not present

## 2023-07-17 DIAGNOSIS — R29898 Other symptoms and signs involving the musculoskeletal system: Secondary | ICD-10-CM | POA: Diagnosis not present

## 2023-07-17 DIAGNOSIS — M25652 Stiffness of left hip, not elsewhere classified: Secondary | ICD-10-CM | POA: Diagnosis not present

## 2023-07-17 DIAGNOSIS — R2689 Other abnormalities of gait and mobility: Secondary | ICD-10-CM | POA: Diagnosis not present

## 2023-07-17 DIAGNOSIS — R6889 Other general symptoms and signs: Secondary | ICD-10-CM | POA: Diagnosis not present

## 2023-07-17 DIAGNOSIS — Z96642 Presence of left artificial hip joint: Secondary | ICD-10-CM | POA: Diagnosis not present

## 2023-07-19 DIAGNOSIS — Z96651 Presence of right artificial knee joint: Secondary | ICD-10-CM | POA: Diagnosis not present

## 2023-07-19 DIAGNOSIS — Z96642 Presence of left artificial hip joint: Secondary | ICD-10-CM | POA: Diagnosis not present

## 2023-07-19 DIAGNOSIS — R29898 Other symptoms and signs involving the musculoskeletal system: Secondary | ICD-10-CM | POA: Diagnosis not present

## 2023-07-19 DIAGNOSIS — R6889 Other general symptoms and signs: Secondary | ICD-10-CM | POA: Diagnosis not present

## 2023-07-19 DIAGNOSIS — R2689 Other abnormalities of gait and mobility: Secondary | ICD-10-CM | POA: Diagnosis not present

## 2023-07-19 DIAGNOSIS — M25652 Stiffness of left hip, not elsewhere classified: Secondary | ICD-10-CM | POA: Diagnosis not present

## 2023-07-25 DIAGNOSIS — M25652 Stiffness of left hip, not elsewhere classified: Secondary | ICD-10-CM | POA: Diagnosis not present

## 2023-07-25 DIAGNOSIS — R6889 Other general symptoms and signs: Secondary | ICD-10-CM | POA: Diagnosis not present

## 2023-07-25 DIAGNOSIS — R2689 Other abnormalities of gait and mobility: Secondary | ICD-10-CM | POA: Diagnosis not present

## 2023-07-25 DIAGNOSIS — Z96642 Presence of left artificial hip joint: Secondary | ICD-10-CM | POA: Diagnosis not present

## 2023-07-25 DIAGNOSIS — R29898 Other symptoms and signs involving the musculoskeletal system: Secondary | ICD-10-CM | POA: Diagnosis not present

## 2023-07-27 DIAGNOSIS — Z7409 Other reduced mobility: Secondary | ICD-10-CM | POA: Diagnosis not present

## 2023-07-27 DIAGNOSIS — Z96642 Presence of left artificial hip joint: Secondary | ICD-10-CM | POA: Diagnosis not present

## 2023-07-27 DIAGNOSIS — M25652 Stiffness of left hip, not elsewhere classified: Secondary | ICD-10-CM | POA: Diagnosis not present

## 2023-07-27 DIAGNOSIS — R29898 Other symptoms and signs involving the musculoskeletal system: Secondary | ICD-10-CM | POA: Diagnosis not present

## 2023-08-02 DIAGNOSIS — R2689 Other abnormalities of gait and mobility: Secondary | ICD-10-CM | POA: Diagnosis not present

## 2023-08-02 DIAGNOSIS — Z96642 Presence of left artificial hip joint: Secondary | ICD-10-CM | POA: Diagnosis not present

## 2023-08-02 DIAGNOSIS — Z7409 Other reduced mobility: Secondary | ICD-10-CM | POA: Diagnosis not present

## 2023-08-02 DIAGNOSIS — R29898 Other symptoms and signs involving the musculoskeletal system: Secondary | ICD-10-CM | POA: Diagnosis not present

## 2023-08-02 DIAGNOSIS — R6889 Other general symptoms and signs: Secondary | ICD-10-CM | POA: Diagnosis not present

## 2023-08-02 DIAGNOSIS — M25652 Stiffness of left hip, not elsewhere classified: Secondary | ICD-10-CM | POA: Diagnosis not present

## 2023-08-09 DIAGNOSIS — M25652 Stiffness of left hip, not elsewhere classified: Secondary | ICD-10-CM | POA: Diagnosis not present

## 2023-08-09 DIAGNOSIS — Z7409 Other reduced mobility: Secondary | ICD-10-CM | POA: Diagnosis not present

## 2023-08-09 DIAGNOSIS — Z96642 Presence of left artificial hip joint: Secondary | ICD-10-CM | POA: Diagnosis not present

## 2023-08-09 DIAGNOSIS — R29898 Other symptoms and signs involving the musculoskeletal system: Secondary | ICD-10-CM | POA: Diagnosis not present

## 2023-08-09 DIAGNOSIS — R6889 Other general symptoms and signs: Secondary | ICD-10-CM | POA: Diagnosis not present

## 2023-08-09 DIAGNOSIS — R2689 Other abnormalities of gait and mobility: Secondary | ICD-10-CM | POA: Diagnosis not present

## 2023-08-16 DIAGNOSIS — Z7409 Other reduced mobility: Secondary | ICD-10-CM | POA: Diagnosis not present

## 2023-08-16 DIAGNOSIS — R29898 Other symptoms and signs involving the musculoskeletal system: Secondary | ICD-10-CM | POA: Diagnosis not present

## 2023-08-16 DIAGNOSIS — Z96642 Presence of left artificial hip joint: Secondary | ICD-10-CM | POA: Diagnosis not present

## 2023-11-09 DIAGNOSIS — K080 Exfoliation of teeth due to systemic causes: Secondary | ICD-10-CM | POA: Diagnosis not present

## 2023-11-22 DIAGNOSIS — L814 Other melanin hyperpigmentation: Secondary | ICD-10-CM | POA: Diagnosis not present

## 2023-11-22 DIAGNOSIS — L821 Other seborrheic keratosis: Secondary | ICD-10-CM | POA: Diagnosis not present

## 2023-11-22 DIAGNOSIS — D225 Melanocytic nevi of trunk: Secondary | ICD-10-CM | POA: Diagnosis not present

## 2023-11-22 DIAGNOSIS — Z86018 Personal history of other benign neoplasm: Secondary | ICD-10-CM | POA: Diagnosis not present

## 2023-11-28 DIAGNOSIS — H5203 Hypermetropia, bilateral: Secondary | ICD-10-CM | POA: Diagnosis not present

## 2023-12-07 ENCOUNTER — Ambulatory Visit (INDEPENDENT_AMBULATORY_CARE_PROVIDER_SITE_OTHER): Payer: 59 | Admitting: Nurse Practitioner

## 2023-12-07 ENCOUNTER — Encounter: Payer: Self-pay | Admitting: Nurse Practitioner

## 2023-12-07 VITALS — BP 152/90 | HR 88 | Ht 63.5 in | Wt 226.8 lb

## 2023-12-07 DIAGNOSIS — Z6839 Body mass index (BMI) 39.0-39.9, adult: Secondary | ICD-10-CM | POA: Diagnosis not present

## 2023-12-07 DIAGNOSIS — Z1231 Encounter for screening mammogram for malignant neoplasm of breast: Secondary | ICD-10-CM

## 2023-12-07 DIAGNOSIS — Z0000 Encounter for general adult medical examination without abnormal findings: Secondary | ICD-10-CM

## 2023-12-07 DIAGNOSIS — E890 Postprocedural hypothyroidism: Secondary | ICD-10-CM

## 2023-12-07 DIAGNOSIS — R944 Abnormal results of kidney function studies: Secondary | ICD-10-CM | POA: Diagnosis not present

## 2023-12-07 DIAGNOSIS — Z13820 Encounter for screening for osteoporosis: Secondary | ICD-10-CM

## 2023-12-07 DIAGNOSIS — R7303 Prediabetes: Secondary | ICD-10-CM

## 2023-12-07 DIAGNOSIS — R030 Elevated blood-pressure reading, without diagnosis of hypertension: Secondary | ICD-10-CM

## 2023-12-07 NOTE — Assessment & Plan Note (Signed)
 Stable with no concerning symptoms at this time. Will repeat labs today and make changes to dosage as necessary. Due for refills soon. Will send based on labs.

## 2023-12-07 NOTE — Assessment & Plan Note (Signed)
 Recheck labs today. Diet and exercise managed.

## 2023-12-07 NOTE — Assessment & Plan Note (Signed)
 No symptoms at this time. Recheck labs today. Monitor BP to reduce risks of kidney damage

## 2023-12-07 NOTE — Assessment & Plan Note (Signed)
 Physical and EKG normal today.  BP was elevated- patient will recheck at home and send results to me for evaluation.  Labs pending today Review of preventative services and recommendations discussed.

## 2023-12-07 NOTE — Assessment & Plan Note (Addendum)
 Elevation noted today, but no prior history. She did have a recent elevation while giving blood, but no other history. Recommend monitoring and sending the results to me through MyChart in the next 2 weeks to ensure that the levels are not running higher than normal outside of the office.

## 2023-12-07 NOTE — Assessment & Plan Note (Signed)
 Unable to exercise as much due to recent surgery on hip and knee. Plans to start with [employer] in the near future looking for a pool and gym option. Discussed these options and recommendations today. Will continue to monitor.

## 2023-12-07 NOTE — Patient Instructions (Addendum)
 You can have the pneumonia, shingles, and tetanus booster all at the pharmacy. I do recommend them for your protection.    Kristin Ramirez , Thank you for taking time to come for your Medicare Wellness Visit. I appreciate your ongoing commitment to your health goals. Please review the following plan we discussed and let me know if I can assist you in the future.   These are the goals we discussed:  Goals       Patient Stated (pt-stated)      Join a health facility and start routine exercise again.         This is a list of the screening recommended for you and due dates:  Health Maintenance  Topic Date Due   DTaP/Tdap/Td vaccine (1 - Tdap) Never done   Zoster (Shingles) Vaccine (1 of 2) Never done   Pneumococcal Vaccine for age over 29 (1 of 1 - PCV) Never done   Mammogram  07/02/2023   DEXA scan (bone density measurement)  11/06/2023   COVID-19 Vaccine (1) 12/23/2023*   Pap with HPV screening  12/06/2024*   Flu Shot  01/12/2024   Medicare Annual Wellness Visit  12/06/2024   Cologuard (Stool DNA test)  12/25/2025   Hepatitis B Vaccine  Aged Out   HPV Vaccine  Aged Out   Meningitis B Vaccine  Aged Out   Colon Cancer Screening  Discontinued   Hepatitis C Screening  Discontinued   HIV Screening  Discontinued  *Topic was postponed. The date shown is not the original due date.   WEIGHT LOSS PLANNING  For best management of weight, it is vital to balance intake versus output. This means the number of calories burned per day must be less than the calories you take in with food and drink.   I recommend trying to follow a diet with the following: Calories: 1200-1500 calories per day Carbohydrates: 150-180 grams of carbohydrates per day  Why: Gives your body enough quick fuel for cells to maintain normal function without sending them into starvation mode.  Protein: At least 90 grams of protein per day- 30 grams with each meal Why: Protein takes longer and uses more energy than  carbohydrates to break down for fuel. The carbohydrates in your meals serves as quick energy sources and proteins help use some of that extra quick energy to break down to produce long term energy. This helps you not feel hungry as quickly and protein breakdown burns calories.  Water: Drink AT LEAST 64 ounces of water per day  Why: Water is essential to healthy metabolism. Water helps to fill the stomach and keep you fuller longer. Water is required for healthy digestion and filtering of waste in the body.  Fat: Limit fats in your diet- when choosing fats, choose foods with lower fats content such as lean meats (chicken, fish, [place]).  Why: Increased fat intake leads to storage for later. Once you burn your carbohydrate energy, your body goes into fat and protein breakdown mode to help you loose weight.  Cholesterol: Fats and oils that are LIQUID at room temperature are best. Choose vegetable oils (olive oil, avocado oil, nuts). Avoid fats that are SOLID at room temperature (animal fats, processed meats). Healthy fats are often found in whole grains, beans, nuts, seeds, and berries.  Why: Elevated cholesterol levels lead to build up of cholesterol on the inside of your blood vessels. This will eventually cause the blood vessels to become hard and can lead to high blood  pressure and damage to your organs. When the blood flow is reduced, but the pressure is high from cholesterol buildup, parts of the cholesterol can break off and form clots that can go to the brain or heart leading to a stroke or heart attack.  Fiber: Increase amount of SOLUBLE the fiber in your diet. This helps to fill you up, lowers cholesterol, and helps with digestion. Some foods high in soluble fiber are oats, peas, beans, apples, carrots, barley, and citrus fruits.   Why: Fiber fills you up, helps remove excess cholesterol, and aids in healthy digestion which are all very important in weight management.   I recommend the following  as a minimum activity routine: Purposeful walk or other physical activity at least 20 minutes every single day. This means purposefully taking a walk, jog, bike, swim, treadmill, elliptical, dance, etc.  This activity should be ABOVE your normal daily activities, such as walking at work. Goal exercise should be at least 150 minutes a week- work your way up to this.   Heart Rate: Your maximum exercise heart rate should be 220 - Your Age in Years. When exercising, get your heart rate up, but avoid going over the maximum targeted heart rate.  60-70% of your maximum heart rate is where you tend to burn the most fat. To find this number:  220 - Age In Years= Max HR  Max HR x 0.6 (or 0.7) = Fat Burning HR The Fat Burning HR is your goal heart rate while working out to burn the most fat.  NEVER exercise to the point your feel lightheaded, weak, nauseated, dizzy. If you experience ANY of these symptoms- STOP exercise! Allow yourself to cool down and your heart rate to come down. Then restart slower next time.  If at ANY TIME you feel chest pain or chest pressure during exercise, STOP IMMEDIATELY and seek medical attention.

## 2023-12-07 NOTE — Progress Notes (Signed)
 Subjective:    Kristin Ramirez is a 63 y.o. female who presents for Preventative Services visit and chronic medical problems/med check visit.    Primary Care Provider Nakaila Freeze, Camie BRAVO, NP here for primary care  Exercise Current exercise habits: Knee and hip rehab exercises at home every day. She is starting walking again after vacation next week.    Nutrition/Diet Current diet: in general, a healthy diet  , likes salty snacks  Depression Screen    12/07/2023    8:13 AM  Depression screen PHQ 2/9  Decreased Interest 0  Down, Depressed, Hopeless 0  PHQ - 2 Score 0    Activities of Daily Living Screen/Functional Status Survey Is the patient deaf or have difficulty hearing?: No Does the patient have difficulty seeing, even when wearing glasses/contacts?: No Does the patient have difficulty concentrating, remembering, or making decisions?: No Does the patient have difficulty walking or climbing stairs?: No Does the patient have difficulty dressing or bathing?: No Does the patient have difficulty doing errands alone such as visiting a doctor's office or shopping?: No  Can patient draw a clock face showing 3:15 oclock, yes  Fall Risk Screen    12/07/2023    8:13 AM 12/05/2022   10:18 AM  Fall Risk   Falls in the past year? 0 0  Number falls in past yr: 0 0  Injury with Fall? 0 0  Risk for fall due to : No Fall Risks No Fall Risks  Follow up Falls evaluation completed Falls evaluation completed    Gait Assessment: Normal gait observed yes  Advanced directives Does patient have a Health Care Power of Attorney? Yes Does patient have a Living Will? Yes  Past Medical History:  Diagnosis Date   Hyperlipidemia    Thyroid  disease     Past Surgical History:  Procedure Laterality Date   ANKLE RECONSTRUCTION  06/2007   left   CHOLECYSTECTOMY     SHOULDER SURGERY     right and left    Social History   Socioeconomic History   Marital status: Divorced    Spouse name: Not on  file   Number of children: 0   Years of education: Not on file   Highest education level: Not on file  Occupational History   Occupation: retired from [employer]  Tobacco Use   Smoking status: Former    Current packs/day: 0.00    Types: Cigarettes    Quit date: 10/17/2009    Years since quitting: 14.1   Smokeless tobacco: Never  Vaping Use   Vaping status: Never Used  Substance and Sexual Activity   Alcohol use: No   Drug use: No   Sexual activity: Not Currently  Other Topics Concern   Not on file  Social History Narrative   Not on file   Social Drivers of Health   Financial Resource Strain: Low Risk  (12/07/2023)   Overall Financial Resource Strain (CARDIA)    Difficulty of Paying Living Expenses: Not hard at all  Food Insecurity: No Food Insecurity (12/07/2023)   Hunger Vital Sign    Worried About Running Out of Food in the Last Year: Never true    Ran Out of Food in the Last Year: Never true  Transportation Needs: No Transportation Needs (12/07/2023)   PRAPARE - Administrator, Civil Service (Medical): No    Lack of Transportation (Non-Medical): No  Physical Activity: Insufficiently Active (12/07/2023)   Exercise Vital Sign    Days  of Exercise per Week: 2 days    Minutes of Exercise per Session: 20 min  Stress: No Stress Concern Present (12/07/2023)   [employer] of Occupational Health - Occupational Stress Questionnaire    Feeling of Stress: Only a little  Social Connections: Moderately Integrated (12/07/2023)   Social Connection and Isolation Panel    Frequency of Communication with Friends and Family: More than three times a week    Frequency of Social Gatherings with Friends and Family: More than three times a week    Attends Religious Services: More than 4 times per year    Active Member of [employer] or Organizations: Yes    Attends Engineer, structural: More than 4 times per year    Marital Status: Divorced  Intimate Partner Violence: Not At  Risk (12/07/2023)   Humiliation, Afraid, Rape, and Kick questionnaire    Fear of Current or Ex-Partner: No    Emotionally Abused: No    Physically Abused: No    Sexually Abused: No    Family History  Problem Relation Age of Onset   Hypertension Mother    Hypertension Father    Heart disease Father    Bladder Cancer Brother      Current Outpatient Medications:    levothyroxine  (SYNTHROID ) 125 MCG tablet, Take one tablet ( ) by mouth 6 mornings a week. Skip the 7th morning., Disp: 90 tablet, Rfl: 3   methocarbamol (ROBAXIN) 500 MG tablet, Take 500 mg by mouth every 6 (six) hours as needed., Disp: , Rfl:    Multiple Vitamin (MULTIVITAMIN) tablet, Take 1 tablet by mouth daily., Disp: , Rfl:   No Known Allergies  History reviewed: allergies, current medications, past family history, past medical history, past social history, past surgical history and problem list  Chronic issues discussed: Labs for thyroid   Acute issues discussed: Blood pressure monitoring  Objective:      Biometrics BP (!) 152/90   Pulse 88   Ht 5' 3.5 (1.613 m)   Wt 226 lb 12.8 oz (102.9 kg)   BMI 39.55 kg/m   Cognitive Testing  Alert? Yes  Normal Appearance?Yes  Oriented to person? Yes  Place? Yes   Time? Yes  Recall of three objects?  Yes  Can perform simple calculations? Yes  Displays appropriate judgment?Yes  Can read the correct time from a watch face?Yes  General appearance: alert, no distress, WD/WN, 63 y/o female  Nutritional Status: Inadequate calore intake? no Loss of muscle mass? no Loss of fat beneath skin? no Localized or general edema? no Diminished functional status? no  Other pertinent exam: HEENT: normocephalic, sclerae anicteric, TMs pearly, nares patent, no discharge or erythema, pharynx normal Oral cavity: MMM, no lesions Neck: supple, no lymphadenopathy, no thyromegaly, no masses Heart: RRR, normal S1, S2, no murmurs Lungs: CTA bilaterally, no wheezes, rhonchi,  or rales Abdomen: +bs, soft, non tender, non distended, no masses, no hepatomegaly, no splenomegaly Musculoskeletal: nontender, no swelling, no obvious deformity Extremities: no edema, no cyanosis, no clubbing Pulses: 2+ symmetric, upper and lower extremities, normal cap refill Neurological: alert, oriented x 3, CN2-12 intact, strength normal upper extremities and lower extremities, sensation normal throughout, DTRs 2+ throughout, no cerebellar signs, gait normal Psychiatric: normal affect, behavior normal, pleasant    Assessment:   Encounter Diagnoses  Name Primary?   Welcome to Medicare preventive visit Yes   Prediabetes    Decreased glomerular filtration rate (GFR)    Postoperative hypothyroidism    BMI 39.0-39.9,adult    Obesity,  morbid (HCC)    Elevated blood pressure reading in office without diagnosis of hypertension    Screening mammogram for breast cancer    Screening for osteoporosis      Plan:   A preventative services visit was completed today.  During the course of the visit today, we discussed and counseled about appropriate screening and preventive services.  A health risk assessment was established today that included a review of current medications, allergies, social history, family history, medical and preventative health history, biometrics, and preventative screenings to identify potential safety concerns or impairments.  A personalized plan was printed today for your records and use.   Personalized health advice and education was given today to reduce health risks and promote self management and wellness.  Information regarding end of life planning was discussed today.  Recommendations: I recommend a yearly ophthalmology/optometry visit for glaucoma screening and eye checkup I recommended a yearly dental visit for hygiene and checkup Advanced directives - discussed nature and purpose of Advanced Directives, encouraged them to complete them if they have not done  so and/or encouraged them to get us  a copy if they have done this already. I recommend a screening mammogram every 1-2 years Your last cologuard was last year.  I recommend you have a repeat in 2 years We ordered a bone density today  Referrals today: NA  Immunizations: I recommended a yearly influenza vaccine, typically in September when the vaccine is usually available Is the Pneumococcal vaccine up to date: recommended. Is the Shingles vaccine up to date: recommended.   Is the Td/Tdap vaccine up to date: recommended.   Medicare Attestation A preventative services visit was completed today.  During the course of the visit the patient was educated and counseled about appropriate screening and preventive services.  A health risk assessment was established with the patient that included a review of current medications, allergies, social history, family history, medical and preventative health history, biometrics, and preventative screenings to identify potential safety concerns or impairments.  A personalized plan was printed today for the patient's records and use.   Personalized health advice and education was given today to reduce health risks and promote self management and wellness.  Information regarding end of life planning was discussed today.  Camie CHARLENA Doing, NP   12/07/2023

## 2023-12-08 LAB — CBC WITH DIFFERENTIAL/PLATELET
Basophils Absolute: 0.1 10*3/uL (ref 0.0–0.2)
Basos: 1 %
EOS (ABSOLUTE): 0.3 10*3/uL (ref 0.0–0.4)
Eos: 4 %
Hematocrit: 41.9 % (ref 34.0–46.6)
Hemoglobin: 13 g/dL (ref 11.1–15.9)
Immature Grans (Abs): 0 10*3/uL (ref 0.0–0.1)
Immature Granulocytes: 0 %
Lymphocytes Absolute: 1.7 10*3/uL (ref 0.7–3.1)
Lymphs: 21 %
MCH: 26.9 pg (ref 26.6–33.0)
MCHC: 31 g/dL — ABNORMAL LOW (ref 31.5–35.7)
MCV: 87 fL (ref 79–97)
Monocytes Absolute: 0.5 10*3/uL (ref 0.1–0.9)
Monocytes: 6 %
Neutrophils Absolute: 5.5 10*3/uL (ref 1.4–7.0)
Neutrophils: 68 %
Platelets: 389 10*3/uL (ref 150–450)
RBC: 4.83 x10E6/uL (ref 3.77–5.28)
RDW: 16.6 % — ABNORMAL HIGH (ref 11.7–15.4)
WBC: 8.1 10*3/uL (ref 3.4–10.8)

## 2023-12-08 LAB — CMP14+EGFR
ALT: 13 IU/L (ref 0–32)
AST: 16 IU/L (ref 0–40)
Albumin: 4.4 g/dL (ref 3.9–4.9)
Alkaline Phosphatase: 77 IU/L (ref 44–121)
BUN/Creatinine Ratio: 15 (ref 12–28)
BUN: 14 mg/dL (ref 8–27)
Bilirubin Total: 0.4 mg/dL (ref 0.0–1.2)
CO2: 19 mmol/L — ABNORMAL LOW (ref 20–29)
Calcium: 10 mg/dL (ref 8.7–10.3)
Chloride: 105 mmol/L (ref 96–106)
Creatinine, Ser: 0.94 mg/dL (ref 0.57–1.00)
Globulin, Total: 3 g/dL (ref 1.5–4.5)
Glucose: 97 mg/dL (ref 70–99)
Potassium: 4.6 mmol/L (ref 3.5–5.2)
Sodium: 141 mmol/L (ref 134–144)
Total Protein: 7.4 g/dL (ref 6.0–8.5)
eGFR: 67 mL/min/{1.73_m2} (ref >59)

## 2023-12-08 LAB — LIPID PANEL
Chol/HDL Ratio: 4 ratio (ref 0.0–4.4)
Cholesterol, Total: 246 mg/dL — ABNORMAL HIGH (ref 100–199)
HDL: 62 mg/dL (ref >39)
LDL Chol Calc (NIH): 163 mg/dL — ABNORMAL HIGH (ref 0–99)
Triglycerides: 118 mg/dL (ref 0–149)
VLDL Cholesterol Cal: 21 mg/dL (ref 5–40)

## 2023-12-08 LAB — HEMOGLOBIN A1C
Est. average glucose Bld gHb Est-mCnc: 114 mg/dL
Hgb A1c MFr Bld: 5.6 % (ref 4.8–5.6)

## 2023-12-08 LAB — TSH: TSH: 0.683 u[IU]/mL (ref 0.450–4.500)

## 2023-12-08 LAB — T4, FREE: Free T4: 1.28 ng/dL (ref 0.82–1.77)

## 2023-12-13 ENCOUNTER — Ambulatory Visit: Payer: Self-pay | Admitting: Nurse Practitioner

## 2023-12-16 ENCOUNTER — Other Ambulatory Visit: Payer: Self-pay | Admitting: Nurse Practitioner

## 2023-12-16 DIAGNOSIS — Z0000 Encounter for general adult medical examination without abnormal findings: Secondary | ICD-10-CM

## 2023-12-16 DIAGNOSIS — E890 Postprocedural hypothyroidism: Secondary | ICD-10-CM

## 2023-12-16 DIAGNOSIS — Z6836 Body mass index (BMI) 36.0-36.9, adult: Secondary | ICD-10-CM

## 2023-12-21 ENCOUNTER — Telehealth: Payer: Self-pay

## 2023-12-21 ENCOUNTER — Other Ambulatory Visit: Payer: Self-pay

## 2023-12-21 DIAGNOSIS — E785 Hyperlipidemia, unspecified: Secondary | ICD-10-CM

## 2023-12-21 DIAGNOSIS — M65311 Trigger thumb, right thumb: Secondary | ICD-10-CM | POA: Diagnosis not present

## 2023-12-21 DIAGNOSIS — R7303 Prediabetes: Secondary | ICD-10-CM

## 2023-12-21 MED ORDER — ATORVASTATIN CALCIUM 10 MG PO TABS: 10.0000 mg | ORAL_TABLET | Freq: Every day | ORAL | 1 refills | Status: DC

## 2023-12-21 NOTE — Telephone Encounter (Signed)
 I called pt. Back and went over labs with the pt.   Copied from CRM [id number]. Topic: Clinical - Lab/Test Results >> Dec 21, 2023  2:18 PM [name] wrote: Reason for CRM: Patient called. Returned call to Kristin Ramirez. Attempted to reach CAL due to looks like per note he wanted to speak to her about medication. Received vm. Call disconnected when trying to reconnect with Patient. Thank You

## 2023-12-22 ENCOUNTER — Ambulatory Visit
Admission: RE | Admit: 2023-12-22 | Discharge: 2023-12-22 | Disposition: A | Discharge status: Home / Self Care | Source: Ambulatory Visit | Attending: Nurse Practitioner

## 2023-12-22 DIAGNOSIS — Z1231 Encounter for screening mammogram for malignant neoplasm of breast: Secondary | ICD-10-CM

## 2024-01-26 ENCOUNTER — Encounter: Admitting: Nurse Practitioner

## 2024-03-25 ENCOUNTER — Other Ambulatory Visit

## 2024-03-25 DIAGNOSIS — E785 Hyperlipidemia, unspecified: Secondary | ICD-10-CM

## 2024-03-25 DIAGNOSIS — R7303 Prediabetes: Secondary | ICD-10-CM

## 2024-03-26 LAB — CMP14+EGFR
ALT: 14 IU/L (ref 0–32)
AST: 16 IU/L (ref 0–40)
Albumin: 4.3 g/dL (ref 3.9–4.9)
Alkaline Phosphatase: 81 IU/L (ref 49–135)
BUN/Creatinine Ratio: 13 (ref 12–28)
BUN: 12 mg/dL (ref 8–27)
Bilirubin Total: 0.5 mg/dL (ref 0.0–1.2)
CO2: 19 mmol/L — ABNORMAL LOW (ref 20–29)
Calcium: 9.8 mg/dL (ref 8.7–10.3)
Chloride: 108 mmol/L — ABNORMAL HIGH (ref 96–106)
Creatinine, Ser: 0.89 mg/dL (ref 0.57–1.00)
Globulin, Total: 2.7 g/dL (ref 1.5–4.5)
Glucose: 106 mg/dL — ABNORMAL HIGH (ref 70–99)
Potassium: 4.1 mmol/L (ref 3.5–5.2)
Sodium: 142 mmol/L (ref 134–144)
Total Protein: 7 g/dL (ref 6.0–8.5)
eGFR: 72 mL/min/1.73 (ref >59)

## 2024-03-26 LAB — LIPID PANEL
Chol/HDL Ratio: 3.3 ratio (ref 0.0–4.4)
Cholesterol, Total: 183 mg/dL (ref 100–199)
HDL: 56 mg/dL (ref >39)
LDL Chol Calc (NIH): 107 mg/dL — ABNORMAL HIGH (ref 0–99)
Triglycerides: 114 mg/dL (ref 0–149)
VLDL Cholesterol Cal: 20 mg/dL (ref 5–40)

## 2024-04-03 ENCOUNTER — Ambulatory Visit: Payer: Self-pay | Admitting: Nurse Practitioner

## 2024-04-08 DIAGNOSIS — M1711 Unilateral primary osteoarthritis, right knee: Secondary | ICD-10-CM | POA: Diagnosis not present

## 2024-04-08 DIAGNOSIS — Z96651 Presence of right artificial knee joint: Secondary | ICD-10-CM | POA: Diagnosis not present

## 2024-05-16 DIAGNOSIS — K080 Exfoliation of teeth due to systemic causes: Secondary | ICD-10-CM | POA: Diagnosis not present

## 2024-05-23 ENCOUNTER — Ambulatory Visit: Payer: Self-pay

## 2024-05-23 DIAGNOSIS — M65311 Trigger thumb, right thumb: Secondary | ICD-10-CM | POA: Diagnosis not present

## 2024-05-23 NOTE — Telephone Encounter (Signed)
 FYI Only or Action Required?: FYI only for provider: appointment scheduled on 05/24/24.  Patient was last seen in primary care on 12/07/2023 by Early, Camie BRAVO, NP.  Called Nurse Triage reporting Hypertension.  Symptoms began today.  Interventions attempted: Nothing.  Symptoms are: stable.  Triage Disposition: See Physician Within 24 Hours  Patient/caregiver understands and will follow disposition?: No, wishes to speak with PCP  Copied from CRM #[id number]. Topic: Clinical - Red Word Triage >> May 23, 2024  8:52 AM Antwanette L wrote: Red Word that prompted transfer to Nurse Triage: Pt bp is 195/98 but she has no current symptoms. >> May 23, 2024  9:05 AM Antwanette L wrote: Patient unable to remain on hold due to long wait time.She   is requesting a callback a [phone]  Reason for Disposition  Systolic BP >= 180 OR Diastolic >= 110  Answer Assessment - Initial Assessment Questions Pt called in stating that she had one occurrence of elevated BP at her doctors appt this morning; 195/98 without symptoms. Pt denies hx of HTN or HTN medications. Pt did stop and get a BP cuff today on her way home and would prefer to keep a log of BP readings and document any symptoms. Discussed importance of daily logs but if she develops symptoms to notify PCP or if top number exceeds 200 or bottom exceeds 100. She voiced understanding. Follow up appt made with PCP per pt request, she did not want to be seen sooner with a different provider. Appointment scheduled for evaluation and pt placed on waitlist. Patient agrees with plan of care, and will call back if anything changes, or if symptoms worsen.     1. BLOOD PRESSURE: What is your blood pressure? Did you take at least two measurements 5 minutes apart?     No; pt states she was at the doctor this morning and her BP was 195/98 without any symptoms. States that she has a family hx of HTN but she personally does not have HTN  2. ONSET: When did you  take your blood pressure?     N/a   3. HOW: How did you take your blood pressure? (e.g., automatic home BP monitor, visiting nurse)     Manually at doctors office   4. HISTORY: Do you have a history of high blood pressure?     No   5. MEDICINES: Are you taking any medicines for blood pressure? Have you missed any doses recently?     No; pt takes cholesterol and thyroid  medication, she did take medications this morning   6. OTHER SYMPTOMS: Do you have any symptoms? (e.g., blurred vision, chest pain, difficulty breathing, headache, weakness)     None  Protocols used: Blood Pressure - High-A-AH

## 2024-05-24 ENCOUNTER — Ambulatory Visit: Admitting: Nurse Practitioner

## 2024-05-24 ENCOUNTER — Encounter: Payer: Self-pay | Admitting: Nurse Practitioner

## 2024-05-24 VITALS — BP 162/98 | HR 82 | Wt 230.8 lb

## 2024-05-24 DIAGNOSIS — I10 Essential (primary) hypertension: Secondary | ICD-10-CM | POA: Insufficient documentation

## 2024-05-24 DIAGNOSIS — Z86018 Personal history of other benign neoplasm: Secondary | ICD-10-CM | POA: Insufficient documentation

## 2024-05-24 DIAGNOSIS — L719 Rosacea, unspecified: Secondary | ICD-10-CM | POA: Insufficient documentation

## 2024-05-24 DIAGNOSIS — D225 Melanocytic nevi of trunk: Secondary | ICD-10-CM | POA: Insufficient documentation

## 2024-05-24 MED ORDER — HYDROCHLOROTHIAZIDE 25 MG PO TABS: 25.0000 mg | ORAL_TABLET | Freq: Every day | ORAL | 0 refills | Status: AC

## 2024-05-24 NOTE — Progress Notes (Signed)
 Camie FORBES Doing, DNP, AGNP-c [facility] [address] [place], [place] [address] [phone]   ACUTE VISIT : Acute or New Concern Visit on 05/24/2024  Blood pressure (!) 162/98, pulse 82, weight 230 lb 12.8 oz (104.7 kg).   Subjective:  other (Elevated BP, last time she gave blood it was high, got a shot in thumb yesterday and it was high, automatic machine at [facility] office, )   History of Present Illness Kristin Ramirez is a 63 year old female who presents today with concerns of recent hypertension noted at other offices.   Her blood pressure has been elevated over the past few years, with a recent significant increase noted during a blood donation and a doctor's office visit, reaching approximately 190 mmHg. She has not been on any antihypertensive medication previously. No associated symptoms such as headaches, dizziness, or vision changes. She has a family history of hypertension, with her grandmother, father, mother, and brother all on blood pressure medication. She has recently acquired a blood pressure cuff for home monitoring.  She received a corticosteroid injection in her thumb yesterday for trigger finger, which has alleviated the pain but not the clicking. A similar injection in July took about seven to eight days to relieve symptoms. She hopes this recent injection will be the last one needed.  She is currently taking Synthroid  at a dose of 125 mcg for hypothyroidism. She has a history of knee surgery and experiences some swelling in her feet, particularly when traveling long distances. She has been doing exercises to manage this and has signed up for a stretching class.  She has a history of significant weight loss with Weight [employer], having lost 87 pounds before the COVID-19 pandemic. She is considering resuming weight loss efforts and has been engaging in joint exercises and plans to attend a stretching class with a friend. ROS negative except for what is  listed in HPI. History, Medications, Surgery, SDOH, and Family History reviewed and updated as appropriate.  Objective:  Physical Exam Vitals and nursing note reviewed.  Constitutional:      Appearance: Normal appearance.  HENT:     Head: Normocephalic.  Eyes:     Pupils: Pupils are equal, round, and reactive to light.  Cardiovascular:     Rate and Rhythm: Normal rate and regular rhythm.     Pulses: Normal pulses.     Heart sounds: Normal heart sounds.  Pulmonary:     Effort: Pulmonary effort is normal.     Breath sounds: Normal breath sounds.  Musculoskeletal:        General: Normal range of motion.     Cervical back: Normal range of motion.  Skin:    General: Skin is warm.  Neurological:     General: No focal deficit present.     Mental Status: She is alert and oriented to person, place, and time.  Psychiatric:        Mood and Affect: Mood normal.         Assessment & Plan:   Assessment & Plan Primary hypertension Elevated blood pressure with recent readings as high as 190 mmHg systolic at recent appt with hand specialist. No symptoms such as headaches, dizziness, or vision changes. Family history of hypertension. Weight may contribute to elevated blood pressure. She is exercising and has plans to increase her activity with a friend. Diet and exercise, as well as handouts provided today.  - Prescribed hydrochlorothiazide, a well-tolerated diuretic, with potential side effect of increased  urination. - Provided handout on proper blood pressure measurement technique. - Instructed to monitor blood pressure at home and report if consistently above 140/85 mmHg. - Encouraged lifestyle modifications including dietary changes and increased physical activity. - Will recheck in February at already scheduled appt Orders:   hydrochlorothiazide (HYDRODIURIL) 25 MG tablet; Take 1 tablet (25 mg total) by mouth daily.  General Health Maintenance Discussion on lifestyle modifications  for weight management and overall health improvement. Emphasis on dietary changes and regular physical activity. - Provided handouts on high protein, low carb snack options and meal plans. - Encouraged regular physical activity, aiming for at least 20 minutes of continuous activity daily. - Discussed the importance of maintaining a balanced diet and regular exercise as a lifestyle change rather than a temporary diet.  Amirr Achord E Carey Johndrow, DNP, AGNP-c

## 2024-05-24 NOTE — Assessment & Plan Note (Addendum)
 Elevated blood pressure with recent readings as high as 190 mmHg systolic at recent appt with hand specialist. No symptoms such as headaches, dizziness, or vision changes. Family history of hypertension. Weight may contribute to elevated blood pressure. She is exercising and has plans to increase her activity with a friend. Diet and exercise, as well as handouts provided today.  - Prescribed hydrochlorothiazide, a well-tolerated diuretic, with potential side effect of increased urination. - Provided handout on proper blood pressure measurement technique. - Instructed to monitor blood pressure at home and report if consistently above 140/85 mmHg. - Encouraged lifestyle modifications including dietary changes and increased physical activity. - Will recheck in February at already scheduled appt Orders:   hydrochlorothiazide (HYDRODIURIL) 25 MG tablet; Take 1 tablet (25 mg total) by mouth daily.

## 2024-05-24 NOTE — Patient Instructions (Addendum)
 If your BP is running higher than 140/85 on average after 2 weeks on the medication, please send me a MyChart message and let me know. Otherwise, we will recheck in February and make sure you are feeling ok and your averages are looking good. This will also give us  a chance to check your metabolic panel to make sure the diuretic hasn't thrown any electrolytes off.    WEIGHT LOSS PLANNING Your progress today shows:     05/24/2024   10:11 AM 12/07/2023    8:17 AM 12/05/2022   10:19 AM  Vitals with BMI  Height  5' 3.5 5' 4  Weight 230 lbs 13 oz 226 lbs 13 oz 224 lbs 6 oz  BMI  39.54 38.5  Systolic 162 152 869  Diastolic 98 90 84  Pulse 82 88 69    For best management of weight, it is vital to balance intake versus output. This means the number of calories burned per day must be less than the calories you take in with food and drink.   I recommend trying to follow a diet with the following: Calories: 1200-1500 calories per day Carbohydrates: 150-180 grams of carbohydrates per day  Why: Gives your body enough quick fuel for cells to maintain normal function without sending them into starvation mode.  Protein: At least 90 grams of protein per day- 30 grams with each meal Why: Protein takes longer and uses more energy than carbohydrates to break down for fuel. The carbohydrates in your meals serves as quick energy sources and proteins help use some of that extra quick energy to break down to produce long term energy. This helps you not feel hungry as quickly and protein breakdown burns calories.  Water: Drink AT LEAST 64 ounces of water per day  Why: Water is essential to healthy metabolism. Water helps to fill the stomach and keep you fuller longer. Water is required for healthy digestion and filtering of waste in the body.  Fat: Limit fats in your diet- when choosing fats, choose foods with lower fats content such as lean meats (chicken, fish, turkey).  Why: Increased fat intake leads to  storage for later. Once you burn your carbohydrate energy, your body goes into fat and protein breakdown mode to help you loose weight.  Cholesterol: Fats and oils that are LIQUID at room temperature are best. Choose vegetable oils (olive oil, avocado oil, nuts). Avoid fats that are SOLID at room temperature (animal fats, processed meats). Healthy fats are often found in whole grains, beans, nuts, seeds, and berries.  Why: Elevated cholesterol levels lead to build up of cholesterol on the inside of your blood vessels. This will eventually cause the blood vessels to become hard and can lead to high blood pressure and damage to your organs. When the blood flow is reduced, but the pressure is high from cholesterol buildup, parts of the cholesterol can break off and form clots that can go to the brain or heart leading to a stroke or heart attack.  Fiber: Increase amount of SOLUBLE the fiber in your diet. This helps to fill you up, lowers cholesterol, and helps with digestion. Some foods high in soluble fiber are oats, peas, beans, apples, carrots, barley, and citrus fruits.   Why: Fiber fills you up, helps remove excess cholesterol, and aids in healthy digestion which are all very important in weight management.   I recommend the following as a minimum activity routine: Purposeful walk or other physical activity at least 20  minutes every single day. This means purposefully taking a walk, jog, bike, swim, treadmill, elliptical, dance, etc.  This activity should be ABOVE your normal daily activities, such as walking at work. Goal exercise should be at least 150 minutes a week- work your way up to this.   Heart Rate: Your maximum exercise heart rate should be 220 - Your Age in Years. When exercising, get your heart rate up, but avoid going over the maximum targeted heart rate.  60-70% of your maximum heart rate is where you tend to burn the most fat. To find this number:  220 - Age In Years= Max HR  Max  HR x 0.6 (or 0.7) = Fat Burning HR The Fat Burning HR is your goal heart rate while working out to burn the most fat.  NEVER exercise to the point your feel lightheaded, weak, nauseated, dizzy. If you experience ANY of these symptoms- STOP exercise! Allow yourself to cool down and your heart rate to come down. Then restart slower next time.  If at ANY TIME you feel chest pain or chest pressure during exercise, STOP IMMEDIATELY and seek medical attention.

## 2024-06-12 ENCOUNTER — Other Ambulatory Visit: Payer: Self-pay | Admitting: Nurse Practitioner

## 2024-07-25 ENCOUNTER — Ambulatory Visit: Admitting: Nurse Practitioner

## 2024-12-26 ENCOUNTER — Ambulatory Visit: Payer: Self-pay | Admitting: Nurse Practitioner
